# Patient Record
Sex: Male | Born: 1950 | Race: White | Hispanic: No | Marital: Married | Smoking: Former smoker
Health system: Southern US, Community
[De-identification: ages and names within clinical notes are randomized; demographics above are authoritative.]

## PROBLEM LIST (undated history)

## (undated) DIAGNOSIS — Z8709 Personal history of other diseases of the respiratory system: Secondary | ICD-10-CM

## (undated) DIAGNOSIS — M1990 Unspecified osteoarthritis, unspecified site: Secondary | ICD-10-CM

## (undated) HISTORY — PX: OTHER SURGICAL HISTORY: SHX169

## (undated) HISTORY — PX: HERNIA REPAIR: SHX51

---

## 2000-11-05 ENCOUNTER — Ambulatory Visit (HOSPITAL_COMMUNITY): Admission: RE | Admit: 2000-11-05 | Discharge: 2000-11-05 | Payer: Self-pay | Admitting: Gastroenterology

## 2003-12-26 ENCOUNTER — Ambulatory Visit: Payer: Self-pay | Admitting: Family Medicine

## 2005-10-07 ENCOUNTER — Ambulatory Visit: Payer: Self-pay | Admitting: Family Medicine

## 2006-02-12 ENCOUNTER — Ambulatory Visit: Payer: Self-pay | Admitting: Family Medicine

## 2006-08-13 ENCOUNTER — Ambulatory Visit: Payer: Self-pay | Admitting: Family Medicine

## 2010-07-05 NOTE — Procedures (Signed)
[place]  Patient:    Wesley Colon, WEEKLY Visit Number: [id number] MRN: [id number]          Service Type: END Location: ENDO Attending Physician:  Orland Mustard Dictated by:   Llana Aliment. Randa Evens, M.D. Proc. Date: 11/05/00 Admit Date:  11/05/2000   CC:         Delaney Meigs, M.D.   Procedure Report  PROCEDURE PERFORMED:  Colonoscopy.  ENDOSCOPIST:  Llana Aliment. Randa Evens, M.D.  MEDICATIONS USED:  Fentanyl 50 mcg, Versed 7 mg IV.  INSTRUMENT:  Adult Olympus video colonoscope.  INDICATIONS:  Family history of colon cancer.  DESCRIPTION OF PROCEDURE:  The procedure had been explained to the patient and consent obtained.  With the patient in the left lateral decubitus position, the Olympus video colonoscope was inserted.  The adult scope was used.  Using abdominal pressure and position changes, we were able to reach the cecum The ileocecal valve and appendiceal orifice were identified.  The patient had an excellent prep.  The scope was withdrawn.  The cecum, ascending colon, hepatic flexure, transverse colon, splenic flexure, descending and sigmoid colon were seen well upon withdrawal.  No polyps or other lesions were seen.  Scope withdrawn, patient tolerated the procedure well.  Maintained on low flow oxygen and pulse oximeter throughout the procedure.  ASSESSMENT:  No evidence of colon polyps in a gentleman who is at high risk for colon cancer.  PLAN:  Will recommend repeating the procedure in five years. Dictated by:   Llana Aliment. Randa Evens, M.D. Attending Physician:  Orland Mustard DD:  11/05/00 TD:  11/05/00 Job: [id number]

## 2015-03-01 ENCOUNTER — Ambulatory Visit (HOSPITAL_COMMUNITY)
Admission: RE | Admit: 2015-03-01 | Discharge: 2015-03-01 | Disposition: A | Discharge status: Home / Self Care | Payer: Medicare Other | Source: Ambulatory Visit | Attending: Family Medicine | Admitting: Family Medicine

## 2015-03-01 ENCOUNTER — Other Ambulatory Visit (HOSPITAL_COMMUNITY): Payer: Self-pay | Admitting: Family Medicine

## 2015-03-01 DIAGNOSIS — M5136 Other intervertebral disc degeneration, lumbar region: Secondary | ICD-10-CM | POA: Diagnosis not present

## 2015-03-01 DIAGNOSIS — M5441 Lumbago with sciatica, right side: Secondary | ICD-10-CM

## 2015-03-01 DIAGNOSIS — M5440 Lumbago with sciatica, unspecified side: Secondary | ICD-10-CM | POA: Insufficient documentation

## 2015-03-06 ENCOUNTER — Other Ambulatory Visit (HOSPITAL_COMMUNITY): Payer: Self-pay | Admitting: Family Medicine

## 2015-03-06 DIAGNOSIS — M545 Low back pain: Secondary | ICD-10-CM

## 2015-03-19 ENCOUNTER — Ambulatory Visit (HOSPITAL_COMMUNITY)
Admission: RE | Admit: 2015-03-19 | Discharge: 2015-03-19 | Disposition: A | Discharge status: Home / Self Care | Payer: Medicare Other | Source: Ambulatory Visit | Attending: Family Medicine | Admitting: Family Medicine

## 2015-03-19 DIAGNOSIS — M4806 Spinal stenosis, lumbar region: Secondary | ICD-10-CM | POA: Diagnosis not present

## 2015-03-19 DIAGNOSIS — M545 Low back pain: Secondary | ICD-10-CM | POA: Diagnosis present

## 2016-03-06 DIAGNOSIS — M25551 Pain in right hip: Secondary | ICD-10-CM | POA: Diagnosis not present

## 2016-03-06 DIAGNOSIS — M1611 Unilateral primary osteoarthritis, right hip: Secondary | ICD-10-CM | POA: Diagnosis not present

## 2016-03-10 NOTE — Progress Notes (Signed)
Please place orders in EPIC as patient is being scheduled for a pre-op appointment! Thank you! 

## 2016-03-12 DIAGNOSIS — M25551 Pain in right hip: Secondary | ICD-10-CM | POA: Diagnosis not present

## 2016-03-12 DIAGNOSIS — M1611 Unilateral primary osteoarthritis, right hip: Secondary | ICD-10-CM | POA: Diagnosis not present

## 2016-03-17 NOTE — Patient Instructions (Signed)
Wesley Colon  03/17/2016   Your procedure is scheduled on: 03/25/2016  Report to [facility] Main  Entrance take [place]  elevators to 3rd floor to  [place] at    [place] AM.  Call this number if you have problems the morning of surgery [phone]   Remember: ONLY 1 PERSON MAY GO WITH YOU TO SHORT STAY TO GET  READY MORNING OF [place].  Do not eat food or drink liquids :After Midnight.     Take these medicines the morning of surgery with A SIP OF WATER:                                 You may not have any metal on your body including hair pins and              piercings  Do not wear jewelry, make-up, lotions, powders or perfumes, deodorant                          Men may shave face and neck.   Do not bring valuables to the hospital. [place].  Contacts, dentures or bridgework may not be worn into surgery.  Leave suitcase in the car. After surgery it may be brought to your room.                  Please read over the following fact sheets you were given: _____________________________________________________________________             [facility] - Preparing for Surgery Before surgery, you can play an important role.  Because skin is not sterile, your skin needs to be as free of germs as possible.  You can reduce the number of germs on your skin by washing with CHG (chlorahexidine gluconate) soap before surgery.  CHG is an antiseptic cleaner which kills germs and bonds with the skin to continue killing germs even after washing. Please DO NOT use if you have an allergy to CHG or antibacterial soaps.  If your skin becomes reddened/irritated stop using the CHG and inform your nurse when you arrive at Short Stay. Do not shave (including legs and underarms) for at least 48 hours prior to the first CHG shower.  You may shave your face/neck. Please follow these instructions carefully:  1.  Shower with  CHG Soap the night before surgery and the  morning of Surgery.  2.  If you choose to wash your hair, wash your hair first as usual with your  normal  shampoo.  3.  After you shampoo, rinse your hair and body thoroughly to remove the  shampoo.                           4.  Use CHG as you would any other liquid soap.  You can apply chg directly  to the skin and wash                       Gently with a scrungie or clean washcloth.  5.  Apply the CHG Soap to your body ONLY FROM THE NECK DOWN.   Do not  use on face/ open                           Wound or open sores. Avoid contact with eyes, ears mouth and genitals (private parts).                       Wash face,  Genitals (private parts) with your normal soap.             6.  Wash thoroughly, paying special attention to the area where your surgery  will be performed.  7.  Thoroughly rinse your body with warm water from the neck down.  8.  DO NOT shower/wash with your normal soap after using and rinsing off  the CHG Soap.                9.  Pat yourself dry with a clean towel.            10.  Wear clean pajamas.            11.  Place clean sheets on your bed the night of your first shower and do not  sleep with pets. Day of Surgery : Do not apply any lotions/deodorants the morning of surgery.  Please wear clean clothes to the hospital/surgery center.  FAILURE TO FOLLOW THESE INSTRUCTIONS MAY RESULT IN THE CANCELLATION OF YOUR SURGERY PATIENT SIGNATURE_________________________________  NURSE SIGNATURE__________________________________  ________________________________________________________________________  WHAT IS A BLOOD TRANSFUSION? Blood Transfusion Information  A transfusion is the replacement of blood or some of its parts. Blood is made up of multiple cells which provide different functions.  Red blood cells carry oxygen and are used for blood loss replacement.  White blood cells fight against infection.  Platelets control  bleeding.  Plasma helps clot blood.  Other blood products are available for specialized needs, such as hemophilia or other clotting disorders. BEFORE THE TRANSFUSION  Who gives blood for transfusions?   Healthy volunteers who are fully evaluated to make sure their blood is safe. This is blood bank blood. Transfusion therapy is the safest it has ever been in the practice of medicine. Before blood is taken from a donor, a complete history is taken to make sure that person has no history of diseases nor engages in risky social behavior (examples are intravenous drug use or sexual activity with multiple partners). The donor's travel history is screened to minimize risk of transmitting infections, such as malaria. The donated blood is tested for signs of infectious diseases, such as HIV and hepatitis. The blood is then tested to be sure it is compatible with you in order to minimize the chance of a transfusion reaction. If you or a relative donates blood, this is often done in anticipation of surgery and is not appropriate for emergency situations. It takes many days to process the donated blood. RISKS AND COMPLICATIONS Although transfusion therapy is very safe and saves many lives, the main dangers of transfusion include:   Getting an infectious disease.  Developing a transfusion reaction. This is an allergic reaction to something in the blood you were given. Every precaution is taken to prevent this. The decision to have a blood transfusion has been considered carefully by your caregiver before blood is given. Blood is not given unless the benefits outweigh the risks. AFTER THE TRANSFUSION  Right after receiving a blood transfusion, you will usually feel much better and more energetic. This is especially true if  your red blood cells have gotten low (anemic). The transfusion raises the level of the red blood cells which carry oxygen, and this usually causes an energy increase.  The nurse  administering the transfusion will monitor you carefully for complications. HOME CARE INSTRUCTIONS  No special instructions are needed after a transfusion. You may find your energy is better. Speak with your caregiver about any limitations on activity for underlying diseases you may have. SEEK MEDICAL CARE IF:   Your condition is not improving after your transfusion.  You develop redness or irritation at the intravenous (IV) site. SEEK IMMEDIATE MEDICAL CARE IF:  Any of the following symptoms occur over the next 12 hours:  Shaking chills.  You have a temperature by mouth above 102 F (38.9 C), not controlled by medicine.  Chest, back, or muscle pain.  People around you feel you are not acting correctly or are confused.  Shortness of breath or difficulty breathing.  Dizziness and fainting.  You get a rash or develop hives.  You have a decrease in urine output.  Your urine turns a dark color or changes to pink, red, or brown. Any of the following symptoms occur over the next 10 days:  You have a temperature by mouth above 102 F (38.9 C), not controlled by medicine.  Shortness of breath.  Weakness after normal activity.  The white part of the eye turns yellow (jaundice).  You have a decrease in the amount of urine or are urinating less often.  Your urine turns a dark color or changes to pink, red, or brown. Document Released: 02/01/2000 Document Revised: 04/28/2011 Document Reviewed: 09/20/2007 ExitCare Patient Information 2014 [place].  _______________________________________________________________________  Incentive Spirometer  An incentive spirometer is a tool that can help keep your lungs clear and active. This tool measures how well you are filling your lungs with each breath. Taking long deep breaths may help reverse or decrease the chance of developing breathing (pulmonary) problems (especially infection) following:  A long period of time when you are  unable to move or be active. BEFORE THE PROCEDURE   If the spirometer includes an indicator to show your best effort, your nurse or respiratory therapist will set it to a desired goal.  If possible, sit up straight or lean slightly forward. Try not to slouch.  Hold the incentive spirometer in an upright position. INSTRUCTIONS FOR USE  1. Sit on the edge of your bed if possible, or sit up as far as you can in bed or on a chair. 2. Hold the incentive spirometer in an upright position. 3. Breathe out normally. 4. Place the mouthpiece in your mouth and seal your lips tightly around it. 5. Breathe in slowly and as deeply as possible, raising the piston or the ball toward the top of the column. 6. Hold your breath for 3-5 seconds or for as long as possible. Allow the piston or ball to fall to the bottom of the column. 7. Remove the mouthpiece from your mouth and breathe out normally. 8. Rest for a few seconds and repeat Steps 1 through 7 at least 10 times every 1-2 hours when you are awake. Take your time and take a few normal breaths between deep breaths. 9. The spirometer may include an indicator to show your best effort. Use the indicator as a goal to work toward during each repetition. 10. After each set of 10 deep breaths, practice coughing to be sure your lungs are clear. If you have an incision (  the cut made at the time of surgery), support your incision when coughing by placing a pillow or rolled up towels firmly against it. Once you are able to get out of bed, walk around indoors and cough well. You may stop using the incentive spirometer when instructed by your caregiver.  RISKS AND COMPLICATIONS  Take your time so you do not get dizzy or light-headed.  If you are in pain, you may need to take or ask for pain medication before doing incentive spirometry. It is harder to take a deep breath if you are having pain. AFTER USE  Rest and breathe slowly and easily.  It can be helpful to  keep track of a log of your progress. Your caregiver can provide you with a simple table to help with this. If you are using the spirometer at home, follow these instructions: [place] IF:   You are having difficultly using the spirometer.  You have trouble using the spirometer as often as instructed.  Your pain medication is not giving enough relief while using the spirometer.  You develop fever of 100.5 F (38.1 C) or higher. SEEK IMMEDIATE MEDICAL CARE IF:   You cough up bloody sputum that had not been present before.  You develop fever of 102 F (38.9 C) or greater.  You develop worsening pain at or near the incision site. MAKE SURE YOU:   Understand these instructions.  Will watch your condition.  Will get help right away if you are not doing well or get worse. Document Released: 06/16/2006 Document Revised: 04/28/2011 Document Reviewed: 08/17/2006 [facility] Patient Information 2014 [place], [place].   ________________________________________________________________________

## 2016-03-19 ENCOUNTER — Encounter (HOSPITAL_COMMUNITY): Payer: Self-pay

## 2016-03-19 ENCOUNTER — Encounter (HOSPITAL_COMMUNITY)
Admission: RE | Admit: 2016-03-19 | Discharge: 2016-03-19 | Disposition: A | Discharge status: Home / Self Care | Payer: PPO | Source: Ambulatory Visit | Attending: Orthopedic Surgery | Admitting: Orthopedic Surgery

## 2016-03-19 DIAGNOSIS — M1611 Unilateral primary osteoarthritis, right hip: Secondary | ICD-10-CM | POA: Diagnosis not present

## 2016-03-19 DIAGNOSIS — Z01818 Encounter for other preprocedural examination: Secondary | ICD-10-CM | POA: Insufficient documentation

## 2016-03-19 HISTORY — DX: Unspecified osteoarthritis, unspecified site: M19.90

## 2016-03-19 LAB — CBC
HEMATOCRIT: 44.7 % (ref 39.0–52.0)
HEMOGLOBIN: 15 g/dL (ref 13.0–17.0)
MCH: 31.4 pg (ref 26.0–34.0)
MCHC: 33.6 g/dL (ref 30.0–36.0)
MCV: 93.7 fL (ref 78.0–100.0)
Platelets: 251 10*3/uL (ref 150–400)
RBC: 4.77 MIL/uL (ref 4.22–5.81)
RDW: 12.9 % (ref 11.5–15.5)
WBC: 6.3 10*3/uL (ref 4.0–10.5)

## 2016-03-19 LAB — SURGICAL PCR SCREEN
MRSA, PCR: NEGATIVE
STAPHYLOCOCCUS AUREUS: POSITIVE — AB

## 2016-03-19 LAB — ABO/RH: ABO/RH(D): O NEG

## 2016-03-19 NOTE — Progress Notes (Signed)
LOV 11/27/15 on chart Clearance 12/04/15 on chart

## 2016-03-23 NOTE — H&P (Signed)
TOTAL HIP ADMISSION H&P  Patient is admitted for right total hip arthroplasty, anterior approach.  Subjective:  Chief Complaint:   Right hip primary OA / pain  HPI: Darolyn Rua, 66 y.o. male, has a history of pain and functional disability in the right hip(s) due to arthritis and patient has failed non-surgical conservative treatments for greater than 12 weeks to include NSAID's and/or analgesics, corticosteriod injections and activity modification.  Onset of symptoms was gradual starting 1.5+ years ago with gradually worsening course since that time.The patient noted no past surgery on the right hip(s).  Patient currently rates pain in the right hip at 10 out of 10 with activity. Patient has night pain, worsening of pain with activity and weight bearing, trendelenberg gait, pain that interfers with activities of daily living and pain with passive range of motion. Patient has evidence of periarticular osteophytes and joint space narrowing by imaging studies. This condition presents safety issues increasing the risk of falls. There is no current active infection.   Risks, benefits and expectations were discussed with the patient.  Risks including but not limited to the risk of anesthesia, blood clots, nerve damage, blood vessel damage, failure of the prosthesis, infection and up to and including death.  Patient understand the risks, benefits and expectations and wishes to proceed with surgery.   PCP: Sherrie Mustache, MD  D/C Plans:       Home - No PT,  HEP  Post-op Meds:       No Rx given  Tranexamic Acid:      To be given - IV  Decadron:      Is to be given  FYI:     ASA  Norco     Past Medical History:  Diagnosis Date  . Arthritis     Past Surgical History:  Procedure Laterality Date  . HERNIA REPAIR     inguinal 66 years old  . undescended testical     at 66 yr old    No prescriptions prior to admission.   No Known Allergies   Social History  Substance Use Topics   . Smoking status: Former Research scientist (life sciences)  . Smokeless tobacco: Never Used     Comment: quit when 66 years old  . Alcohol use No       Review of Systems  Constitutional: Negative.   HENT: Negative.   Eyes: Negative.   Respiratory: Negative.   Cardiovascular: Negative.   Gastrointestinal: Negative.   Genitourinary: Negative.   Musculoskeletal: Positive for joint pain.  Skin: Negative.   Neurological: Negative.   Endo/Heme/Allergies: Negative.   Psychiatric/Behavioral: Negative.     Objective:  Physical Exam  Constitutional: He is oriented to person, place, and time. He appears well-developed.  HENT:  Head: Normocephalic.  Eyes: Pupils are equal, round, and reactive to light.  Neck: Neck supple. No JVD present. No tracheal deviation present. No thyromegaly present.  Cardiovascular: Normal rate, regular rhythm, normal heart sounds and intact distal pulses.   Respiratory: Effort normal and breath sounds normal. No respiratory distress. He has no wheezes.  GI: Soft. There is no tenderness. There is no guarding.  Musculoskeletal:       Right hip: He exhibits decreased range of motion, decreased strength, tenderness and bony tenderness. He exhibits no swelling, no deformity and no laceration.  Lymphadenopathy:    He has no cervical adenopathy.  Neurological: He is alert and oriented to person, place, and time.  Skin: Skin is warm and dry.  Psychiatric:  He has a normal mood and affect.       Labs:  Estimated body mass index is 30.13 kg/m as calculated from the following:   Height as of 03/19/16: 5\' 10"  (1.778 m).   Weight as of 03/19/16: 95.3 kg (210 lb).   Imaging Review Plain radiographs demonstrate severe degenerative joint disease of the right hip(s). The bone quality appears to be good for age and reported activity level.  Assessment/Plan:  End stage arthritis, right hip(s)  The patient history, physical examination, clinical judgement of the provider and imaging  studies are consistent with end stage degenerative joint disease of the right hip(s) and total hip arthroplasty is deemed medically necessary. The treatment options including medical management, injection therapy, arthroscopy and arthroplasty were discussed at length. The risks and benefits of total hip arthroplasty were presented and reviewed. The risks due to aseptic loosening, infection, stiffness, dislocation/subluxation,  thromboembolic complications and other imponderables were discussed.  The patient acknowledged the explanation, agreed to proceed with the plan and consent was signed. Patient is being admitted for inpatient treatment for surgery, pain control, PT, OT, prophylactic antibiotics, VTE prophylaxis, progressive ambulation and ADL's and discharge planning.The patient is planning to be discharged home.      [place] Pugh Iretha Kirley   PA-C  03/23/2016, 2:32 PM

## 2016-03-24 NOTE — Anesthesia Preprocedure Evaluation (Addendum)
Anesthesia Evaluation  Patient identified by MRN, date of birth, ID band Patient awake    Reviewed: Allergy & Precautions, H&P , NPO status , Patient's Chart, lab work & pertinent test results  Airway Mallampati: II  TM Distance: >3 FB Neck ROM: Full    Dental no notable dental hx. (+) Teeth Intact, Dental Advisory Given   Pulmonary neg pulmonary ROS, former smoker,    Pulmonary exam normal breath sounds clear to auscultation       Cardiovascular Exercise Tolerance: Good negative cardio ROS   Rhythm:Regular Rate:Normal     Neuro/Psych negative neurological ROS  negative psych ROS   GI/Hepatic negative GI ROS, Neg liver ROS,   Endo/Other  negative endocrine ROS  Renal/GU negative Renal ROS  negative genitourinary   Musculoskeletal  (+) Arthritis , Osteoarthritis,    Abdominal   Peds  Hematology negative hematology ROS (+)   Anesthesia Other Findings   Reproductive/Obstetrics negative OB ROS                            Anesthesia Physical Anesthesia Plan  ASA: II  Anesthesia Plan: Spinal   Post-op Pain Management:    Induction: Intravenous  Airway Management Planned: Simple Face Mask  Additional Equipment:   Intra-op Plan:   Post-operative Plan:   Informed Consent: I have reviewed the patients History and Physical, chart, labs and discussed the procedure including the risks, benefits and alternatives for the proposed anesthesia with the patient or authorized representative who has indicated his/her understanding and acceptance.   Dental advisory given  Plan Discussed with: CRNA  Anesthesia Plan Comments:         Anesthesia Quick Evaluation

## 2016-03-25 ENCOUNTER — Inpatient Hospital Stay (HOSPITAL_COMMUNITY): Payer: PPO

## 2016-03-25 ENCOUNTER — Inpatient Hospital Stay (HOSPITAL_COMMUNITY): Payer: PPO | Admitting: Anesthesiology

## 2016-03-25 ENCOUNTER — Encounter (HOSPITAL_COMMUNITY)
Admission: RE | Disposition: A | Discharge status: Home / Self Care | Payer: Self-pay | Source: Ambulatory Visit | Attending: Orthopedic Surgery

## 2016-03-25 ENCOUNTER — Encounter (HOSPITAL_COMMUNITY): Payer: Self-pay | Admitting: *Deleted

## 2016-03-25 ENCOUNTER — Inpatient Hospital Stay (HOSPITAL_COMMUNITY)
Admission: RE | Admit: 2016-03-25 | Discharge: 2016-03-26 | DRG: 470 | Disposition: A | Discharge status: Home / Self Care | Payer: PPO | Source: Ambulatory Visit | Attending: Orthopedic Surgery | Admitting: Orthopedic Surgery

## 2016-03-25 DIAGNOSIS — Z471 Aftercare following joint replacement surgery: Secondary | ICD-10-CM | POA: Diagnosis not present

## 2016-03-25 DIAGNOSIS — M1611 Unilateral primary osteoarthritis, right hip: Secondary | ICD-10-CM | POA: Diagnosis not present

## 2016-03-25 DIAGNOSIS — E669 Obesity, unspecified: Secondary | ICD-10-CM | POA: Diagnosis not present

## 2016-03-25 DIAGNOSIS — Z96641 Presence of right artificial hip joint: Secondary | ICD-10-CM

## 2016-03-25 DIAGNOSIS — M25551 Pain in right hip: Secondary | ICD-10-CM | POA: Diagnosis not present

## 2016-03-25 DIAGNOSIS — Z6830 Body mass index (BMI) 30.0-30.9, adult: Secondary | ICD-10-CM | POA: Diagnosis not present

## 2016-03-25 DIAGNOSIS — Z96649 Presence of unspecified artificial hip joint: Secondary | ICD-10-CM

## 2016-03-25 DIAGNOSIS — Z87891 Personal history of nicotine dependence: Secondary | ICD-10-CM

## 2016-03-25 HISTORY — PX: TOTAL HIP ARTHROPLASTY: SHX124

## 2016-03-25 HISTORY — DX: Personal history of other diseases of the respiratory system: Z87.09

## 2016-03-25 LAB — TYPE AND SCREEN
ABO/RH(D): O NEG
Antibody Screen: NEGATIVE

## 2016-03-25 SURGERY — ARTHROPLASTY, HIP, TOTAL, ANTERIOR APPROACH
Anesthesia: Spinal | Site: Hip | Laterality: Right

## 2016-03-25 MED ORDER — FENTANYL CITRATE (PF) 100 MCG/2ML IJ SOLN
INTRAMUSCULAR | Status: AC
  Filled 2016-03-25: qty 2

## 2016-03-25 MED ORDER — CEFAZOLIN SODIUM-DEXTROSE 2-4 GM/100ML-% IV SOLN
INTRAVENOUS | Status: AC
  Filled 2016-03-25: qty 100

## 2016-03-25 MED ORDER — BISACODYL 10 MG RE SUPP: 10.0000 mg | Freq: Every day | RECTAL | Status: DC | PRN

## 2016-03-25 MED ORDER — SODIUM CHLORIDE 0.9 % IV SOLN
100.0000 mL/h | INTRAVENOUS | Status: DC
  Administered 2016-03-25 (×2): 100 mL/h via INTRAVENOUS
  Filled 2016-03-25 (×4): qty 1000

## 2016-03-25 MED ORDER — STERILE WATER FOR IRRIGATION IR SOLN
Status: DC | PRN
  Administered 2016-03-25: 2000 mL

## 2016-03-25 MED ORDER — BUPIVACAINE HCL (PF) 0.5 % IJ SOLN
INTRAMUSCULAR | Status: AC
  Filled 2016-03-25: qty 30

## 2016-03-25 MED ORDER — METHOCARBAMOL 500 MG PO TABS
500.0000 mg | ORAL_TABLET | Freq: Four times a day (QID) | ORAL | Status: DC | PRN
  Administered 2016-03-25 – 2016-03-26 (×2): 500 mg via ORAL
  Filled 2016-03-25 (×2): qty 1

## 2016-03-25 MED ORDER — HYDROMORPHONE HCL 1 MG/ML IJ SOLN
0.5000 mg | INTRAMUSCULAR | Status: DC | PRN
  Administered 2016-03-25 (×2): 1 mg via INTRAVENOUS
  Filled 2016-03-25 (×2): qty 1

## 2016-03-25 MED ORDER — ONDANSETRON HCL 4 MG PO TABS: 4.0000 mg | ORAL_TABLET | Freq: Four times a day (QID) | ORAL | Status: DC | PRN

## 2016-03-25 MED ORDER — CEFAZOLIN SODIUM-DEXTROSE 2-4 GM/100ML-% IV SOLN
2.0000 g | INTRAVENOUS | Status: AC
  Administered 2016-03-25: 2 g via INTRAVENOUS

## 2016-03-25 MED ORDER — CEFAZOLIN SODIUM-DEXTROSE 2-4 GM/100ML-% IV SOLN
2.0000 g | Freq: Four times a day (QID) | INTRAVENOUS | Status: AC
  Administered 2016-03-25 (×2): 2 g via INTRAVENOUS
  Filled 2016-03-25 (×2): qty 100

## 2016-03-25 MED ORDER — ASPIRIN 81 MG PO CHEW: 81.0000 mg | CHEWABLE_TABLET | Freq: Two times a day (BID) | ORAL | 0 refills | Status: AC

## 2016-03-25 MED ORDER — FENTANYL CITRATE (PF) 100 MCG/2ML IJ SOLN
INTRAMUSCULAR | Status: DC | PRN
  Administered 2016-03-25: 100 ug via INTRAVENOUS

## 2016-03-25 MED ORDER — MENTHOL 3 MG MT LOZG
1.0000 | LOZENGE | OROMUCOSAL | Status: DC | PRN
  Filled 2016-03-25: qty 9

## 2016-03-25 MED ORDER — PHENOL 1.4 % MT LIQD
1.0000 | OROMUCOSAL | Status: DC | PRN
  Filled 2016-03-25: qty 177

## 2016-03-25 MED ORDER — POLYETHYLENE GLYCOL 3350 17 G PO PACK
17.0000 g | PACK | Freq: Two times a day (BID) | ORAL | Status: DC
  Administered 2016-03-25 – 2016-03-26 (×2): 17 g via ORAL
  Filled 2016-03-25 (×2): qty 1

## 2016-03-25 MED ORDER — FERROUS SULFATE 325 (65 FE) MG PO TABS: 325.0000 mg | ORAL_TABLET | Freq: Three times a day (TID) | ORAL | Status: DC

## 2016-03-25 MED ORDER — DEXAMETHASONE SODIUM PHOSPHATE 10 MG/ML IJ SOLN
10.0000 mg | Freq: Once | INTRAMUSCULAR | Status: AC
  Administered 2016-03-25: 10 mg via INTRAVENOUS

## 2016-03-25 MED ORDER — MIDAZOLAM HCL 5 MG/5ML IJ SOLN
INTRAMUSCULAR | Status: DC | PRN
  Administered 2016-03-25: 2 mg via INTRAVENOUS

## 2016-03-25 MED ORDER — PHENYLEPHRINE 40 MCG/ML (10ML) SYRINGE FOR IV PUSH (FOR BLOOD PRESSURE SUPPORT)
PREFILLED_SYRINGE | INTRAVENOUS | Status: AC
  Filled 2016-03-25: qty 10

## 2016-03-25 MED ORDER — HYDROMORPHONE HCL 1 MG/ML IJ SOLN: 0.2500 mg | INTRAMUSCULAR | Status: DC | PRN

## 2016-03-25 MED ORDER — METOCLOPRAMIDE HCL 5 MG PO TABS
5.0000 mg | ORAL_TABLET | Freq: Three times a day (TID) | ORAL | Status: DC | PRN
Start: 2016-03-25 — End: 2016-03-26

## 2016-03-25 MED ORDER — DOCUSATE SODIUM 100 MG PO CAPS: 100.0000 mg | ORAL_CAPSULE | Freq: Two times a day (BID) | ORAL | 0 refills | Status: DC

## 2016-03-25 MED ORDER — DEXAMETHASONE SODIUM PHOSPHATE 10 MG/ML IJ SOLN
INTRAMUSCULAR | Status: AC
  Filled 2016-03-25: qty 1

## 2016-03-25 MED ORDER — METHOCARBAMOL 500 MG PO TABS: 500.0000 mg | ORAL_TABLET | Freq: Four times a day (QID) | ORAL | 0 refills | Status: DC | PRN

## 2016-03-25 MED ORDER — PROPOFOL 10 MG/ML IV BOLUS
INTRAVENOUS | Status: AC
  Filled 2016-03-25: qty 40

## 2016-03-25 MED ORDER — ONDANSETRON HCL 4 MG/2ML IJ SOLN
INTRAMUSCULAR | Status: AC
  Filled 2016-03-25: qty 2

## 2016-03-25 MED ORDER — SODIUM CHLORIDE 0.9 % IR SOLN
Status: DC | PRN
  Administered 2016-03-25: 1000 mL

## 2016-03-25 MED ORDER — MAGNESIUM CITRATE PO SOLN: 1.0000 | Freq: Once | ORAL | Status: DC | PRN

## 2016-03-25 MED ORDER — ALUM & MAG HYDROXIDE-SIMETH 200-200-20 MG/5ML PO SUSP: 30.0000 mL | ORAL | Status: DC | PRN

## 2016-03-25 MED ORDER — HYDROCODONE-ACETAMINOPHEN 7.5-325 MG PO TABS
1.0000 | ORAL_TABLET | ORAL | Status: DC
  Administered 2016-03-25 – 2016-03-26 (×6): 2 via ORAL
  Filled 2016-03-25 (×6): qty 2

## 2016-03-25 MED ORDER — FERROUS SULFATE 325 (65 FE) MG PO TABS
325.0000 mg | ORAL_TABLET | Freq: Three times a day (TID) | ORAL | Status: DC
  Administered 2016-03-25 – 2016-03-26 (×2): 325 mg via ORAL
  Filled 2016-03-25 (×2): qty 1

## 2016-03-25 MED ORDER — ONDANSETRON HCL 4 MG/2ML IJ SOLN: 4.0000 mg | Freq: Four times a day (QID) | INTRAMUSCULAR | Status: DC | PRN

## 2016-03-25 MED ORDER — METHOCARBAMOL 1000 MG/10ML IJ SOLN
500.0000 mg | Freq: Four times a day (QID) | INTRAVENOUS | Status: DC | PRN
  Filled 2016-03-25: qty 5

## 2016-03-25 MED ORDER — ONDANSETRON HCL 4 MG/2ML IJ SOLN
INTRAMUSCULAR | Status: DC | PRN
  Administered 2016-03-25: 4 mg via INTRAVENOUS

## 2016-03-25 MED ORDER — DIPHENHYDRAMINE HCL 25 MG PO CAPS: 25.0000 mg | ORAL_CAPSULE | Freq: Four times a day (QID) | ORAL | Status: DC | PRN

## 2016-03-25 MED ORDER — MIDAZOLAM HCL 2 MG/2ML IJ SOLN
INTRAMUSCULAR | Status: AC
  Filled 2016-03-25: qty 2

## 2016-03-25 MED ORDER — ASPIRIN 81 MG PO CHEW
81.0000 mg | CHEWABLE_TABLET | Freq: Two times a day (BID) | ORAL | Status: DC
  Administered 2016-03-25 – 2016-03-26 (×2): 81 mg via ORAL
  Filled 2016-03-25 (×2): qty 1

## 2016-03-25 MED ORDER — PHENYLEPHRINE 40 MCG/ML (10ML) SYRINGE FOR IV PUSH (FOR BLOOD PRESSURE SUPPORT)
PREFILLED_SYRINGE | INTRAVENOUS | Status: DC | PRN
  Administered 2016-03-25 (×6): 80 ug via INTRAVENOUS

## 2016-03-25 MED ORDER — TRANEXAMIC ACID 1000 MG/10ML IV SOLN
1000.0000 mg | INTRAVENOUS | Status: AC
  Administered 2016-03-25: 1000 mg via INTRAVENOUS
  Filled 2016-03-25: qty 1100

## 2016-03-25 MED ORDER — LACTATED RINGERS IV SOLN
INTRAVENOUS | Status: DC | PRN
  Administered 2016-03-25 (×3): via INTRAVENOUS

## 2016-03-25 MED ORDER — CHLORHEXIDINE GLUCONATE 4 % EX LIQD: 60.0000 mL | Freq: Once | CUTANEOUS | Status: DC

## 2016-03-25 MED ORDER — PROPOFOL 500 MG/50ML IV EMUL
INTRAVENOUS | Status: DC | PRN
  Administered 2016-03-25: 100 ug/kg/min via INTRAVENOUS

## 2016-03-25 MED ORDER — POLYETHYLENE GLYCOL 3350 17 G PO PACK: 17.0000 g | PACK | Freq: Two times a day (BID) | ORAL | 0 refills | Status: DC

## 2016-03-25 MED ORDER — DOCUSATE SODIUM 100 MG PO CAPS
100.0000 mg | ORAL_CAPSULE | Freq: Two times a day (BID) | ORAL | Status: DC
  Administered 2016-03-25 – 2016-03-26 (×2): 100 mg via ORAL
  Filled 2016-03-25 (×2): qty 1

## 2016-03-25 MED ORDER — DEXAMETHASONE SODIUM PHOSPHATE 10 MG/ML IJ SOLN
10.0000 mg | Freq: Once | INTRAMUSCULAR | Status: AC
  Administered 2016-03-26: 10 mg via INTRAVENOUS
  Filled 2016-03-25: qty 1

## 2016-03-25 MED ORDER — HYDROCODONE-ACETAMINOPHEN 7.5-325 MG PO TABS: 1.0000 | ORAL_TABLET | ORAL | 0 refills | Status: DC | PRN

## 2016-03-25 MED ORDER — METOCLOPRAMIDE HCL 5 MG/ML IJ SOLN
5.0000 mg | Freq: Three times a day (TID) | INTRAMUSCULAR | Status: DC | PRN
Start: 2016-03-25 — End: 2016-03-26

## 2016-03-25 MED ORDER — BUPIVACAINE HCL (PF) 0.5 % IJ SOLN
INTRAMUSCULAR | Status: DC | PRN
  Administered 2016-03-25: 15 mg via INTRATHECAL

## 2016-03-25 MED ORDER — CELECOXIB 200 MG PO CAPS
200.0000 mg | ORAL_CAPSULE | Freq: Two times a day (BID) | ORAL | Status: DC
  Administered 2016-03-25 – 2016-03-26 (×2): 200 mg via ORAL
  Filled 2016-03-25 (×2): qty 1

## 2016-03-25 SURGICAL SUPPLY — 35 items
BAG ZIPLOCK 12X15 (MISCELLANEOUS) IMPLANT
BLADE SAG 18X100X1.27 (BLADE) ×3 IMPLANT
CAPT HIP TOTAL 2 ×3 IMPLANT
CLOTH BEACON ORANGE TIMEOUT ST (SAFETY) ×3 IMPLANT
COVER PERINEAL POST (MISCELLANEOUS) ×3 IMPLANT
DERMABOND ADVANCED (GAUZE/BANDAGES/DRESSINGS) ×2
DERMABOND ADVANCED .7 DNX12 (GAUZE/BANDAGES/DRESSINGS) ×1 IMPLANT
DRAPE STERI IOBAN 125X83 (DRAPES) ×3 IMPLANT
DRAPE U-SHAPE 47X51 STRL (DRAPES) ×6 IMPLANT
DRESSING AQUACEL AG SP 3.5X10 (GAUZE/BANDAGES/DRESSINGS) ×1 IMPLANT
DRSG AQUACEL AG ADV 3.5X10 (GAUZE/BANDAGES/DRESSINGS) ×3 IMPLANT
DRSG AQUACEL AG SP 3.5X10 (GAUZE/BANDAGES/DRESSINGS) ×3
DURAPREP 26ML APPLICATOR (WOUND CARE) ×3 IMPLANT
ELECT REM PT RETURN 9FT ADLT (ELECTROSURGICAL) ×3
ELECTRODE REM PT RTRN 9FT ADLT (ELECTROSURGICAL) ×1 IMPLANT
GLOVE BIOGEL PI IND STRL 7.5 (GLOVE) ×5 IMPLANT
GLOVE BIOGEL PI IND STRL 8.5 (GLOVE) ×1 IMPLANT
GLOVE BIOGEL PI INDICATOR 7.5 (GLOVE) ×10
GLOVE BIOGEL PI INDICATOR 8.5 (GLOVE) ×2
GLOVE ECLIPSE 8.0 STRL XLNG CF (GLOVE) ×6 IMPLANT
GLOVE ORTHO TXT STRL SZ7.5 (GLOVE) ×3 IMPLANT
GLOVE SURG SS PI 7.0 STRL IVOR (GLOVE) ×3 IMPLANT
GLOVE SURG SS PI 7.5 STRL IVOR (GLOVE) ×3 IMPLANT
GOWN STRL REUS W/ TWL XL LVL3 (GOWN DISPOSABLE) ×1 IMPLANT
GOWN STRL REUS W/TWL LRG LVL3 (GOWN DISPOSABLE) ×3 IMPLANT
GOWN STRL REUS W/TWL XL LVL3 (GOWN DISPOSABLE) ×8 IMPLANT
HOLDER FOLEY CATH W/STRAP (MISCELLANEOUS) ×3 IMPLANT
PACK ANTERIOR HIP CUSTOM (KITS) ×3 IMPLANT
SUT MNCRL AB 4-0 PS2 18 (SUTURE) ×3 IMPLANT
SUT VIC AB 1 CT1 36 (SUTURE) ×9 IMPLANT
SUT VIC AB 2-0 CT1 27 (SUTURE) ×4
SUT VIC AB 2-0 CT1 TAPERPNT 27 (SUTURE) ×2 IMPLANT
SUT VLOC 180 0 24IN GS25 (SUTURE) ×3 IMPLANT
TRAY FOLEY W/METER SILVER 16FR (SET/KITS/TRAYS/PACK) ×3 IMPLANT
YANKAUER SUCT BULB TIP 10FT TU (MISCELLANEOUS) ×3 IMPLANT

## 2016-03-25 NOTE — Discharge Instructions (Signed)

## 2016-03-25 NOTE — Interval H&P Note (Signed)
History and Physical Interval Note:  03/25/2016 7:04 AM  Wesley Colon  has presented today for surgery, with the diagnosis of Right hip osteoarthritis  The various methods of treatment have been discussed with the patient and family. After consideration of risks, benefits and other options for treatment, the patient has consented to  Procedure(s) with comments: RIGHT TOTAL HIP ARTHROPLASTY ANTERIOR APPROACH (Right) - Requests 70 mins as a surgical intervention .  The patient's history has been reviewed, patient examined, no change in status, stable for surgery.  I have reviewed the patient's chart and labs.  Questions were answered to the patient's satisfaction.     Mauri Pole

## 2016-03-25 NOTE — Op Note (Signed)
NAME:  Wesley Colon                ACCOUNT NO.: [id number]      MEDICAL RECORD NO.: [id number]      FACILITY:  [facility]      PHYSICIAN:  Paralee Cancel D  DATE OF BIRTH:  September 28, 1950     DATE OF PROCEDURE:  03/25/2016                                 OPERATIVE REPORT         PREOPERATIVE DIAGNOSIS: Right  hip osteoarthritis.      POSTOPERATIVE DIAGNOSIS:  Right hip osteoarthritis.      PROCEDURE:  Right total hip replacement through an anterior approach   utilizing DePuy THR system, component size 103mm pinnacle cup, a size 36+4 neutral   Altrex liner, a size 6 Hi Tri Lock stem with a 36+1.5 delta ceramic   ball.      SURGEON:  Pietro Cassis. Alvan Dame, M.D.      ASSISTANT:  Danae Orleans, PA-C     ANESTHESIA:  Spinal.      SPECIMENS:  None.      COMPLICATIONS:  None.      BLOOD LOSS:  400 cc     DRAINS:  None.      INDICATION OF THE PROCEDURE:  Wesley Colon is a 66 y.o. male who had   presented to office for evaluation of right hip pain.  Radiographs revealed   progressive degenerative changes with bone-on-bone   articulation to the  hip joint.  The patient had painful limited range of   motion significantly affecting their overall quality of life.  The patient was failing to    respond to conservative measures, and at this point was ready   to proceed with more definitive measures.  The patient has noted progressive   degenerative changes in his hip, progressive problems and dysfunction   with regarding the hip prior to surgery.  Consent was obtained for   benefit of pain relief.  Specific risk of infection, DVT, component   failure, dislocation, need for revision surgery, as well discussion of   the anterior versus posterior approach were reviewed.  Consent was   obtained for benefit of anterior pain relief through an anterior   approach.      PROCEDURE IN DETAIL:  The patient was brought to operative theater.   Once adequate anesthesia,  preoperative antibiotics, 2gm of Ancef, 1 gm of Tranexamic Acid, and 10 mg of Decadron administered.   The patient was positioned supine on the OSI Hanna table.  Once adequate   padding of boney process was carried out, we had predraped out the hip, and  used fluoroscopy to confirm orientation of the pelvis and position.      The right hip was then prepped and draped from proximal iliac crest to   mid thigh with shower curtain technique.      Time-out was performed identifying the patient, planned procedure, and   extremity.     An incision was then made 2 cm distal and lateral to the   anterior superior iliac spine extending over the orientation of the   tensor fascia lata muscle and sharp dissection was carried down to the   fascia of the muscle and protractor placed in the soft tissues.      The fascia was  then incised.  The muscle belly was identified and swept   laterally and retractor placed along the superior neck.  Following   cauterization of the circumflex vessels and removing some pericapsular   fat, a second cobra retractor was placed on the inferior neck.  A third   retractor was placed on the anterior acetabulum after elevating the   anterior rectus.  A L-capsulotomy was along the line of the   superior neck to the trochanteric fossa, then extended proximally and   distally.  Tag sutures were placed and the retractors were then placed   intracapsular.  We then identified the trochanteric fossa and   orientation of my neck cut, confirmed this radiographically   and then made a neck osteotomy with the femur on traction.  The femoral   head was removed without difficulty or complication.  Traction was let   off and retractors were placed posterior and anterior around the   acetabulum.      The labrum and foveal tissue were debrided.  I began reaming with a 37mm   reamer and reamed up to 51mm reamer with good bony bed preparation and a 71mm   cup was chosen.  The final 64mm  Pinnacle cup was then impacted under fluoroscopy  to confirm the depth of penetration and orientation with respect to   abduction.  A screw was placed followed by the hole eliminator.  The final   36+4 neutral Altrex liner was impacted with good visualized rim fit.  The cup was positioned anatomically within the acetabular portion of the pelvis.      At this point, the femur was rolled at 80 degrees.  Further capsule was   released off the inferior aspect of the femoral neck.  I then   released the superior capsule proximally.  The hook was placed laterally   along the femur and elevated manually and held in position with the bed   hook.  The leg was then extended and adducted with the leg rolled to 100   degrees of external rotation.  Once the proximal femur was fully   exposed, I used a box osteotome to set orientation.  I then began   broaching with the starting chili pepper broach and passed this by hand and then broached up to 6.  With the 6 broach in place I chose a high offset neck and did trial reductions.  The offset was appropriate, leg lengths   appeared to be equal best matched with the +1.5 head ball confirmed radiographically.   Given these findings, I went ahead and dislocated the hip, repositioned all   retractors and positioned the right hip in the extended and abducted position.  The final 6 Hi Tri Lock stem was   chosen and it was impacted down to the level of neck cut.  Based on this   and the trial reduction, a 36+1.5 delta ceramic ball was chosen and   impacted onto a clean and dry trunnion, and the hip was reduced.  The   hip had been irrigated throughout the case again at this point.  I did   reapproximate the superior capsular leaflet to the anterior leaflet   using #1 Vicryl.  The fascia of the   tensor fascia lata muscle was then reapproximated using #1 Vicryl and #0 Stratafix sutures.  The   remaining wound was closed with 2-0 Vicryl and running 4-0 Monocryl.   The  hip was cleaned, dried, and dressed  sterilely using Dermabond and   Aquacel dressing.  He was then brought   to recovery room in stable condition tolerating the procedure well.    Danae Orleans, PA-C was present for the entirety of the case involved from   preoperative positioning, perioperative retractor management, general   facilitation of the case, as well as primary wound closure as assistant.            Pietro Cassis Alvan Dame, M.D.        03/25/2016 8:44 AM

## 2016-03-25 NOTE — Anesthesia Postprocedure Evaluation (Signed)
Anesthesia Post Note  Patient: Wesley Colon  Procedure(s) Performed: Procedure(s) (LRB): RIGHT TOTAL HIP ARTHROPLASTY ANTERIOR APPROACH (Right)  Patient location during evaluation: PACU Anesthesia Type: Spinal Level of consciousness: awake and alert Pain management: pain level controlled Vital Signs Assessment: post-procedure vital signs reviewed and stable Respiratory status: spontaneous breathing and respiratory function stable Cardiovascular status: blood pressure returned to baseline and stable Postop Assessment: spinal receding Anesthetic complications: no       Last Vitals:  Vitals:   03/25/16 1100 03/25/16 1120  BP: 110/82   Pulse: 83   Resp: 12 15  Temp: 36.7 C     Last Pain:  Vitals:   03/25/16 1100  TempSrc:   PainSc: 0-No pain                 Brenda Cowher,W. EDMOND

## 2016-03-25 NOTE — Anesthesia Procedure Notes (Signed)
Spinal  Patient location during procedure: OR Start time: 03/25/2016 7:14 AM End time: 03/25/2016 7:16 AM Staffing Resident/CRNA: Harle Stanford R Performed: resident/CRNA  Preanesthetic Checklist Completed: patient identified, site marked, surgical consent, pre-op evaluation, timeout performed, IV checked, risks and benefits discussed and monitors and equipment checked Spinal Block Patient position: sitting Prep: Betadine Patient monitoring: heart rate, cardiac monitor, continuous pulse ox and blood pressure Approach: midline Location: L3-4 Needle Needle type: Pencan  Needle gauge: 24 G Needle length: 10 cm Needle insertion depth: 7 cm Assessment Sensory level: T6 Additional Notes Timeout performed. SAB kit date checked. SAB without difficulty

## 2016-03-25 NOTE — Evaluation (Signed)
Physical Therapy Evaluation Patient Details Name: Wesley Colon MRN: [id number] DOB: [birth year] Today's Date: 03/25/2016   History of Present Illness  R DATHA  Clinical Impression  The patient is progressing very well. Ambulated x 200'  Plans Dc  Home with family. Pt admitted with above diagnosis. Pt currently with functional limitations due to the deficits listed below (see PT Problem List) Pt will benefit from skilled PT to increase their independence and safety with mobility to allow discharge to the venue listed below.       Follow Up Recommendations No PT follow up    Equipment Recommendations  None recommended by PT    Recommendations for Other Services       Precautions / Restrictions Precautions Precautions: Fall      Mobility  Bed Mobility Overal bed mobility: Needs Assistance Bed Mobility: Supine to Sit;Sit to Supine     Supine to sit: Min assist Sit to supine: Min assist   General bed mobility comments: assist with rt leg  Transfers Overall transfer level: Needs assistance Equipment used: Rolling walker (2 wheeled) Transfers: Sit to/from Stand Sit to Stand: Min assist         General transfer comment: cues for sfeaty and hand and rt leg position  Ambulation/Gait Ambulation/Gait assistance: Min assist Ambulation Distance (Feet): 200 Feet Assistive device: Rolling walker (2 wheeled) Gait Pattern/deviations: Step-to pattern;Step-through pattern     General Gait Details: cues for sequence  Stairs            Wheelchair Mobility    Modified Rankin (Stroke Patients Only)       Balance                                             Pertinent Vitals/Pain Pain Assessment: 0-10 Pain Score: 4  Pain Location: r hip Pain Descriptors / Indicators: Aching Pain Intervention(s): Premedicated before session    Home Living Family/patient expects to be discharged to:: Private residence Living Arrangements: Spouse/significant  other Available Help at Discharge: Family Type of Home: House Home Access: Stairs to enter Entrance Stairs-Rails: Psychiatric nurse of Steps: 2 Home Layout: One level Home Equipment: Environmental consultant - 2 wheels;Bedside commode      Prior Function                 Hand Dominance        Extremity/Trunk Assessment   Upper Extremity Assessment Upper Extremity Assessment: Overall WFL for tasks assessed    Lower Extremity Assessment Lower Extremity Assessment: RLE deficits/detail RLE Deficits / Details: advances the leg        Communication      Cognition Arousal/Alertness: Awake/alert Behavior During Therapy: WFL for tasks assessed/performed Overall Cognitive Status: Within Functional Limits for tasks assessed                      General Comments      Exercises     Assessment/Plan    PT Assessment Patient needs continued PT services  PT Problem List Decreased strength;Decreased range of motion;Decreased activity tolerance;Decreased mobility;Decreased knowledge of precautions;Decreased safety awareness;Decreased knowledge of use of DME;Pain          PT Treatment Interventions DME instruction;Gait training;Stair training;Functional mobility training;Therapeutic activities;Therapeutic exercise;Patient/family education    PT Goals (Current goals can be found in the Care Plan section)  Acute Rehab PT Goals  Patient Stated Goal: to walk without a limp PT Goal Formulation: With patient Time For Goal Achievement: 03/27/16 Potential to Achieve Goals: Good    Frequency 7X/week   Barriers to discharge        Co-evaluation               End of Session   Activity Tolerance: Patient tolerated treatment well Patient left: in bed;with call bell/phone within reach;with bed alarm set;with family/visitor present Nurse Communication: Mobility status         Time: [id number] PT Time Calculation (min) (ACUTE ONLY): 17 min   Charges:   PT  Evaluation $PT Eval Low Complexity: 1 Procedure     PT G CodesClaretha Colon 03/25/2016, 4:32 PM

## 2016-03-25 NOTE — Transfer of Care (Signed)
Immediate Anesthesia Transfer of Care Note  Patient: Wesley Colon  Procedure(s) Performed: Procedure(s) with comments: RIGHT TOTAL HIP ARTHROPLASTY ANTERIOR APPROACH (Right) - Requests 70 mins  Patient Location: PACU  Anesthesia Type:Spinal  Level of Consciousness: sedated  Airway & Oxygen Therapy: Patient Spontanous Breathing and Patient connected to face mask oxygen  Post-op Assessment: Report given to RN and Post -op Vital signs reviewed and stable  Post vital signs: Reviewed and stable  Last Vitals:  Vitals:   03/25/16 0509  BP: (!) 158/91  Pulse: 100  Resp: 18  Temp: 36.8 C    Last Pain:  Vitals:   03/25/16 0527  TempSrc:   PainSc: 8       Patients Stated Pain Goal: 2 ([id number])  Complications: No apparent anesthesia complications

## 2016-03-26 DIAGNOSIS — E669 Obesity, unspecified: Secondary | ICD-10-CM | POA: Diagnosis present

## 2016-03-26 LAB — CBC
HEMATOCRIT: 36.2 % — AB (ref 39.0–52.0)
HEMOGLOBIN: 11.9 g/dL — AB (ref 13.0–17.0)
MCH: 30.4 pg (ref 26.0–34.0)
MCHC: 32.9 g/dL (ref 30.0–36.0)
MCV: 92.3 fL (ref 78.0–100.0)
Platelets: 229 10*3/uL (ref 150–400)
RBC: 3.92 MIL/uL — AB (ref 4.22–5.81)
RDW: 13.1 % (ref 11.5–15.5)
WBC: 10.9 10*3/uL — ABNORMAL HIGH (ref 4.0–10.5)

## 2016-03-26 LAB — BASIC METABOLIC PANEL
Anion gap: 7 (ref 5–15)
BUN: 11 mg/dL (ref 6–20)
CHLORIDE: 109 mmol/L (ref 101–111)
CO2: 23 mmol/L (ref 22–32)
Calcium: 8.1 mg/dL — ABNORMAL LOW (ref 8.9–10.3)
Creatinine, Ser: 0.91 mg/dL (ref 0.61–1.24)
GFR calc non Af Amer: >60 mL/min (ref >60)
Glucose, Bld: 127 mg/dL — ABNORMAL HIGH (ref 65–99)
POTASSIUM: 3.9 mmol/L (ref 3.5–5.1)
SODIUM: 139 mmol/L (ref 135–145)

## 2016-03-26 NOTE — Evaluation (Signed)
   Occupational Therapy Evaluation Patient Details Name: CLOUDE BITTEL MRN: [id number] DOB: [birth year] Today's Date: 04/16/16    History of Present Illness R DATHA   Clinical Impression   Pt is s/p THA- anterior resulting in the deficits listed below (see OT Problem List). Pt will benefit from skilled OT to increase their safety and independence with ADL and functional mobility for ADL to facilitate discharge to venue listed below.      Follow Up Recommendations  No OT follow up    Equipment Recommendations  None recommended by OT       Precautions / Restrictions Precautions Precautions: Fall Restrictions Weight Bearing Restrictions: No      Mobility Bed Mobility Overal bed mobility: Modified Independent                Transfers Overall transfer level: Modified independent      VC for safety and tecnique                   ADL Overall ADL's : Needs assistance/impaired          Pt overall S - min A with ADL activity . OT educated on ADL activity s/p THA- anterior.Wife will also A as needed.                                      Extremity/Trunk Assessment Upper Extremity Assessment Upper Extremity Assessment: Overall WFL for tasks assessed           Communication     Cognition Arousal/Alertness: Awake/alert Behavior During Therapy: WFL for tasks assessed/performed Overall Cognitive Status: Within Functional Limits for tasks assessed                                Home Living Family/patient expects to be discharged to:: Private residence Living Arrangements: Spouse/significant other Available Help at Discharge: Family Type of Home: House Home Access: Stairs to enter Technical brewer of Steps: 2 Entrance Stairs-Rails: Right;Left Home Layout: One level     Bathroom Shower/Tub: Tub/shower unit         Home Equipment: Environmental consultant - 2 wheels;Bedside commode                         OT  Goals(Current goals can be found in the care plan section) Acute Rehab OT Goals Patient Stated Goal: to walk without a limp  OT Frequency:                End of Session Equipment Utilized During Treatment: Rolling walker Nurse Communication: Mobility status  Activity Tolerance: Patient tolerated treatment well Patient left: in chair   Time: [id number] OT Time Calculation (min): 10 min Charges:  OT General Charges $OT Visit: 1 Procedure OT Evaluation $OT Eval Low Complexity: 1 Procedure G-Codes:    Payton Mccallum D April 16, 2016, 12:02 PM

## 2016-03-26 NOTE — Progress Notes (Signed)
     Subjective: 1 Day Post-Op Procedure(s) (LRB): RIGHT TOTAL HIP ARTHROPLASTY ANTERIOR APPROACH (Right)   Patient reports pain as mild, pain controlled.  No events throughout the night. Feels that he is progressing well.  Ready to be discharged home.   Objective:   VITALS:   Vitals:   03/26/16 0213 03/26/16 0638  BP: 112/69 121/70  Pulse: 78 90  Resp: 16 16  Temp: 98.5 F (36.9 C) 99.4 F (37.4 C)    Dorsiflexion/Plantar flexion intact Incision: dressing C/D/I No cellulitis present Compartment soft  LABS  Recent Labs  03/26/16 0451  HGB 11.9*  HCT 36.2*  WBC 10.9*  PLT 229     Recent Labs  03/26/16 0451  NA 139  K 3.9  BUN 11  CREATININE 0.91  GLUCOSE 127*     Assessment/Plan: 1 Day Post-Op Procedure(s) (LRB): RIGHT TOTAL HIP ARTHROPLASTY ANTERIOR APPROACH (Right) Foley cath d/c'ed Advance diet Up with therapy D/C IV fluids Discharge home Follow up in 2 weeks at [facility]. Follow up with OLIN,Merlyn Conley D in 2 weeks.  Contact information:  [facility] [address], [place] [id number]    Obese (BMI 30-39.9) Estimated body mass index is 30.42 kg/m as calculated from the following:   Height as of this encounter: 5\' 10"  (1.778 m).   Weight as of this encounter: 96.2 kg (212 lb). Patient also counseled that weight may inhibit the healing process Patient counseled that losing weight will help with future health issues        West Pugh. Domanique Huesman   PAC  03/26/2016, 8:24 AM

## 2016-03-26 NOTE — Care Management Note (Signed)
Case Management Note  Patient Details  Name: Wesley Colon MRN: [id number] Date of Birth: [birth year]  Subjective/Objective:                  RIGHT TOTAL HIP ARTHROPLASTY ANTERIOR APPROACH (Right)  Action/Plan: Discharge planning Expected Discharge Date:  03/26/16               Expected Discharge Plan:  Home/Self Care  In-House Referral:     Discharge planning Services  CM Consult  Post Acute Care Choice:  Durable Medical Equipment Choice offered to:  Patient  DME Arranged:  N/A DME Agency:  NA  HH Arranged:  NA HH Agency:  NA  Status of Service:  Completed, signed off  If discussed at Christmas of Stay Meetings, dates discussed:    Additional Comments: CM met with pt in room to discuss discharge needs. Pt confirms no HH services and states he has both a 3n1 and rolling walker at home. NO other CM needs were communicated. Dellie Catholic, RN 03/26/2016, 10:39 AM

## 2016-03-26 NOTE — Progress Notes (Signed)
Physical Therapy Treatment Patient Details Name: NIVAAN FUHR MRN: [id number] DOB: [birth year] Today's Date: 03/26/2016    History of Present Illness R DA THA    PT Comments    Pt ambulated in hallway and performed steps.  Pt tolerating mobility very well.  Also educated and performed LE exercises.  Pt provided with HEP handout.  Pt had no further questions and feels comfortable with d/c home today.   Follow Up Recommendations  No PT follow up     Equipment Recommendations  None recommended by PT    Recommendations for Other Services       Precautions / Restrictions Precautions Precautions: Fall Restrictions Weight Bearing Restrictions: No Other Position/Activity Restrictions: WBAT    Mobility  Bed Mobility Overal bed mobility: Modified Independent                Transfers Overall transfer level: Modified independent Equipment used: Rolling walker (2 wheeled)                Ambulation/Gait Ambulation/Gait assistance: Supervision Ambulation Distance (Feet): 200 Feet Assistive device: Rolling walker (2 wheeled) Gait Pattern/deviations: Step-through pattern;Decreased stride length     General Gait Details: performing well with RW, only cue for turning whole body and not twisting   Stairs Stairs: Yes   Stair Management: Two rails;Alternating pattern;Forwards Number of Stairs: 5 General stair comments: initial verbal cues for technique however pt performed well, x2  Wheelchair Mobility    Modified Rankin (Stroke Patients Only)       Balance                                    Cognition Arousal/Alertness: Awake/alert Behavior During Therapy: WFL for tasks assessed/performed Overall Cognitive Status: Within Functional Limits for tasks assessed                      Exercises Total Joint Exercises Hip ABduction/ADduction: AROM;Right;10 reps;Standing Long Arc Quad: AROM;Right;10 reps;Seated Knee Flexion:  AROM;Right;10 reps;Standing Marching in Standing: AROM;Right;10 reps;Standing    General Comments        Pertinent Vitals/Pain Pain Assessment: 0-10 Pain Score: 2  Pain Location: R hip Pain Descriptors / Indicators: Aching;Sore Pain Intervention(s): Limited activity within patient's tolerance;Monitored during session;Repositioned    Home Living Family/patient expects to be discharged to:: Private residence Living Arrangements: Spouse/significant other Available Help at Discharge: Family Type of Home: House Home Access: Stairs to enter Entrance Stairs-Rails: Right;Left Home Layout: One level Home Equipment: Environmental consultant - 2 wheels;Bedside commode      Prior Function            PT Goals (current goals can now be found in the care plan section) Acute Rehab PT Goals Patient Stated Goal: to walk without a limp Progress towards PT goals: Progressing toward goals    Frequency    7X/week      PT Plan Current plan remains appropriate    Co-evaluation             End of Session   Activity Tolerance: Patient tolerated treatment well Patient left: with call bell/phone within reach;with family/visitor present;in bed     Time: 0903-0920 PT Time Calculation (min) (ACUTE ONLY): 17 min  Charges:  $Therapeutic Exercise: 8-22 mins                    G Codes:      Darrelyn Morro,KATHrine  E 03/26/2016, 12:14 PM Carmelia Bake, PT, DPT 03/26/2016 Pager: [phone]

## 2016-03-28 NOTE — Discharge Summary (Signed)
Physician Discharge Summary  Patient ID: Wesley Colon MRN: [id number] DOB/AGE: Jan 11, 1951 66 y.o.  Admit date: 03/25/2016 Discharge date: 03/26/2016   Procedures:  Procedure(s) (LRB): RIGHT TOTAL HIP ARTHROPLASTY ANTERIOR APPROACH (Right)  Attending Physician:  Dr. Paralee Cancel   Admission Diagnoses:   Right hip primary OA / pain  Discharge Diagnoses:  Principal Problem:   S/P right THA, AA Active Problems:   Obese  Past Medical History:  Diagnosis Date  . Arthritis   . History of bronchitis     HPI:    Wesley Colon, 66 y.o. male, has a history of pain and functional disability in the right hip(s) due to arthritis and patient has failed non-surgical conservative treatments for greater than 12 weeks to include NSAID's and/or analgesics, corticosteriod injections and activity modification.  Onset of symptoms was gradual starting 1.5+ years ago with gradually worsening course since that time.The patient noted no past surgery on the right hip(s).  Patient currently rates pain in the right hip at 10 out of 10 with activity. Patient has night pain, worsening of pain with activity and weight bearing, trendelenberg gait, pain that interfers with activities of daily living and pain with passive range of motion. Patient has evidence of periarticular osteophytes and joint space narrowing by imaging studies. This condition presents safety issues increasing the risk of falls. There is no current active infection.   Risks, benefits and expectations were discussed with the patient.  Risks including but not limited to the risk of anesthesia, blood clots, nerve damage, blood vessel damage, failure of the prosthesis, infection and up to and including death.  Patient understand the risks, benefits and expectations and wishes to proceed with surgery.   PCP: Sherrie Mustache, MD   Discharged Condition: good  Hospital Course:  Patient underwent the above stated procedure on 03/25/2016. Patient  tolerated the procedure well and brought to the recovery room in good condition and subsequently to the floor.  POD #1 BP: 121/70 ; Pulse: 90 ; Temp: 99.4 F (37.4 C) ; Resp: 16 Patient reports pain as mild, pain controlled.  No events throughout the night. Feels that he is progressing well.  Ready to be discharged home.  Dorsiflexion/plantar flexion intact, incision: dressing C/D/I, no cellulitis present and compartment soft.   LABS  Basename    HGB     11.9  HCT     36.2     Discharge Exam: General appearance: alert, cooperative and no distress Extremities: Homans sign is negative, no sign of DVT, no edema, redness or tenderness in the calves or thighs and no ulcers, gangrene or trophic changes  Disposition: Home with follow up in 2 weeks   Follow-up Information    Mauri Pole, MD. Schedule an appointment as soon as possible for a visit in 2 week(s).   Specialty:  Orthopedic Surgery Contact information: [address] [place] [address] [phone]           Discharge Instructions    Call MD / Call 911    Complete by:  As directed    If you experience chest pain or shortness of breath, CALL 911 and be transported to the hospital emergency room.  If you develope a fever above 101 F, pus (white drainage) or increased drainage or redness at the wound, or calf pain, call your surgeon's office.   Change dressing    Complete by:  As directed    Maintain surgical dressing until follow up in the  clinic. If the edges start to pull up, may reinforce with tape. If the dressing is no longer working, may remove and cover with gauze and tape, but must keep the area dry and clean.  Call with any questions or concerns.   Constipation Prevention    Complete by:  As directed    Drink plenty of fluids.  Prune juice may be helpful.  You may use a stool softener, such as Colace (over the counter) 100 mg twice a day.  Use MiraLax (over the counter) for constipation as  needed.   Diet - low sodium heart healthy    Complete by:  As directed    Discharge instructions    Complete by:  As directed    Maintain surgical dressing until follow up in the clinic. If the edges start to pull up, may reinforce with tape. If the dressing is no longer working, may remove and cover with gauze and tape, but must keep the area dry and clean.  Follow up in 2 weeks at [facility]. Call with any questions or concerns.   Increase activity slowly as tolerated    Complete by:  As directed    Weight bearing as tolerated with assist device (walker, cane, etc) as directed, use it as long as suggested by your surgeon or therapist, typically at least 4-6 weeks.   TED hose    Complete by:  As directed    Use stockings (TED hose) for 2 weeks on both leg(s).  You may remove them at night for sleeping.      Allergies as of 03/26/2016   No Known Allergies     Medication List    STOP taking these medications   acetaminophen 500 MG tablet Commonly known as:  TYLENOL   aspirin EC 81 MG tablet Replaced by:  aspirin 81 MG chewable tablet   HYDROcodone-acetaminophen 5-325 MG tablet Commonly known as:  NORCO/VICODIN Replaced by:  HYDROcodone-acetaminophen 7.5-325 MG tablet   ibuprofen 200 MG tablet Commonly known as:  ADVIL,MOTRIN     TAKE these medications   aspirin 81 MG chewable tablet Chew 1 tablet (81 mg total) by mouth 2 (two) times daily. Take for 4 weeks, then resume regular dose. Replaces:  aspirin EC 81 MG tablet   docusate sodium 100 MG capsule Commonly known as:  COLACE Take 1 capsule (100 mg total) by mouth 2 (two) times daily.   ferrous sulfate 325 (65 FE) MG tablet Commonly known as:  FERROUSUL Take 1 tablet (325 mg total) by mouth 3 (three) times daily with meals.   FISH OIL PO Take 950 mg by mouth daily.   HYDROcodone-acetaminophen 7.5-325 MG tablet Commonly known as:  NORCO Take 1-2 tablets by mouth every 4 (four) hours as needed for  moderate pain or severe pain. Replaces:  HYDROcodone-acetaminophen 5-325 MG tablet   methocarbamol 500 MG tablet Commonly known as:  ROBAXIN Take 1 tablet (500 mg total) by mouth every 6 (six) hours as needed for muscle spasms.   OVER THE COUNTER MEDICATION Take 1 capsule by mouth daily. STINGING NETTLE HERBAL SUPPLEMENT   polyethylene glycol packet Commonly known as:  MIRALAX / GLYCOLAX Take 17 g by mouth 2 (two) times daily.   PROBIOTIC PO Take 1 capsule by mouth daily.   PROSTATE SUPPORT PO Take 1 capsule by mouth daily.   TURMERIC PO Take 1 capsule by mouth daily.   Vitamin B-12 5000 MCG Tbdp Take 5,000 mcg by mouth daily.   vitamin C  1000 MG tablet Take 1,000 mg by mouth daily.   Vitamin D3 10000 units Tabs Take 10,000 Units by mouth daily.        Signed: West Pugh. Shahan Starks   PA-C  03/28/2016, 11:51 AM

## 2016-04-14 DIAGNOSIS — E785 Hyperlipidemia, unspecified: Secondary | ICD-10-CM | POA: Diagnosis not present

## 2016-04-14 DIAGNOSIS — Z1159 Encounter for screening for other viral diseases: Secondary | ICD-10-CM | POA: Diagnosis not present

## 2016-05-07 DIAGNOSIS — Z96641 Presence of right artificial hip joint: Secondary | ICD-10-CM | POA: Diagnosis not present

## 2016-05-07 DIAGNOSIS — Z471 Aftercare following joint replacement surgery: Secondary | ICD-10-CM | POA: Diagnosis not present

## 2016-09-08 DIAGNOSIS — H40033 Anatomical narrow angle, bilateral: Secondary | ICD-10-CM | POA: Diagnosis not present

## 2016-09-08 DIAGNOSIS — H2513 Age-related nuclear cataract, bilateral: Secondary | ICD-10-CM | POA: Diagnosis not present

## 2016-10-06 DIAGNOSIS — Z125 Encounter for screening for malignant neoplasm of prostate: Secondary | ICD-10-CM | POA: Diagnosis not present

## 2016-10-06 DIAGNOSIS — E785 Hyperlipidemia, unspecified: Secondary | ICD-10-CM | POA: Diagnosis not present

## 2016-10-06 DIAGNOSIS — Z1159 Encounter for screening for other viral diseases: Secondary | ICD-10-CM | POA: Diagnosis not present

## 2016-10-09 DIAGNOSIS — E785 Hyperlipidemia, unspecified: Secondary | ICD-10-CM | POA: Diagnosis not present

## 2016-10-09 DIAGNOSIS — Z23 Encounter for immunization: Secondary | ICD-10-CM | POA: Diagnosis not present

## 2016-10-09 DIAGNOSIS — Z0000 Encounter for general adult medical examination without abnormal findings: Secondary | ICD-10-CM | POA: Diagnosis not present

## 2016-11-20 DIAGNOSIS — Z23 Encounter for immunization: Secondary | ICD-10-CM | POA: Diagnosis not present

## 2017-04-15 DIAGNOSIS — Z96651 Presence of right artificial knee joint: Secondary | ICD-10-CM | POA: Diagnosis not present

## 2017-04-16 DIAGNOSIS — E785 Hyperlipidemia, unspecified: Secondary | ICD-10-CM | POA: Diagnosis not present

## 2017-05-29 DIAGNOSIS — H40033 Anatomical narrow angle, bilateral: Secondary | ICD-10-CM | POA: Diagnosis not present

## 2017-05-29 DIAGNOSIS — H04123 Dry eye syndrome of bilateral lacrimal glands: Secondary | ICD-10-CM | POA: Diagnosis not present

## 2017-10-20 DIAGNOSIS — E785 Hyperlipidemia, unspecified: Secondary | ICD-10-CM | POA: Diagnosis not present

## 2017-10-21 IMAGING — DX DG HIP (WITH OR WITHOUT PELVIS) 1V PORT*R*
2 series · 2 of 2 positions shown · non-contrast
Comparison: Preoperative study December 04, 2015

CLINICAL DATA: Status post right-sided anterior approach total hip
joint replacement. History of osteoarthritis. Imaging performed in
the [HOSPITAL] PACU.

EXAM:
DG HIP (WITH OR WITHOUT PELVIS) 1V PORT RIGHT

[hip ap]
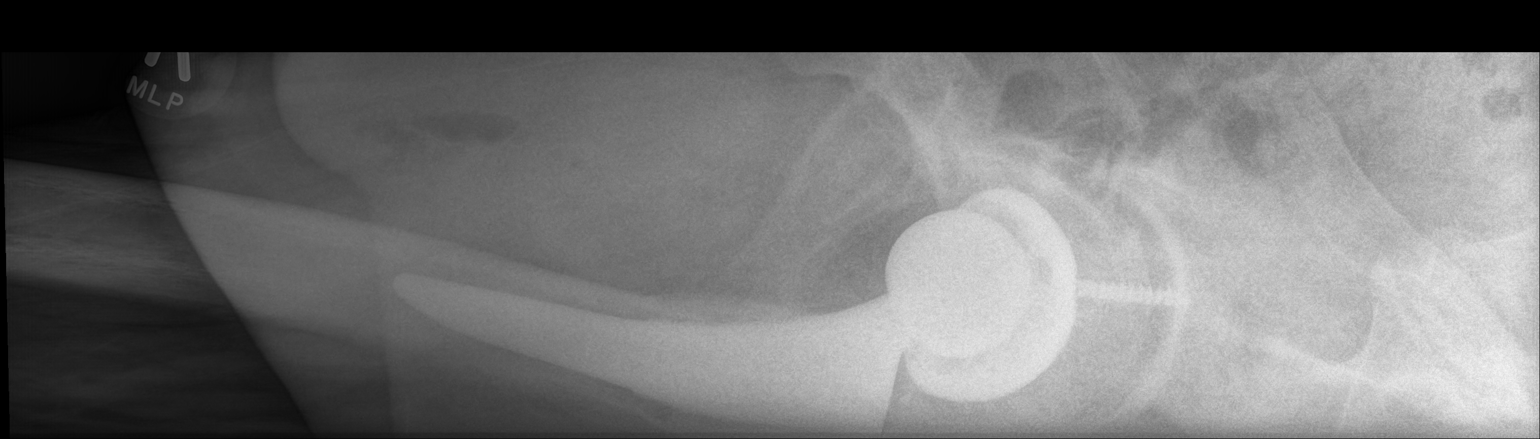

[pelvis ap]
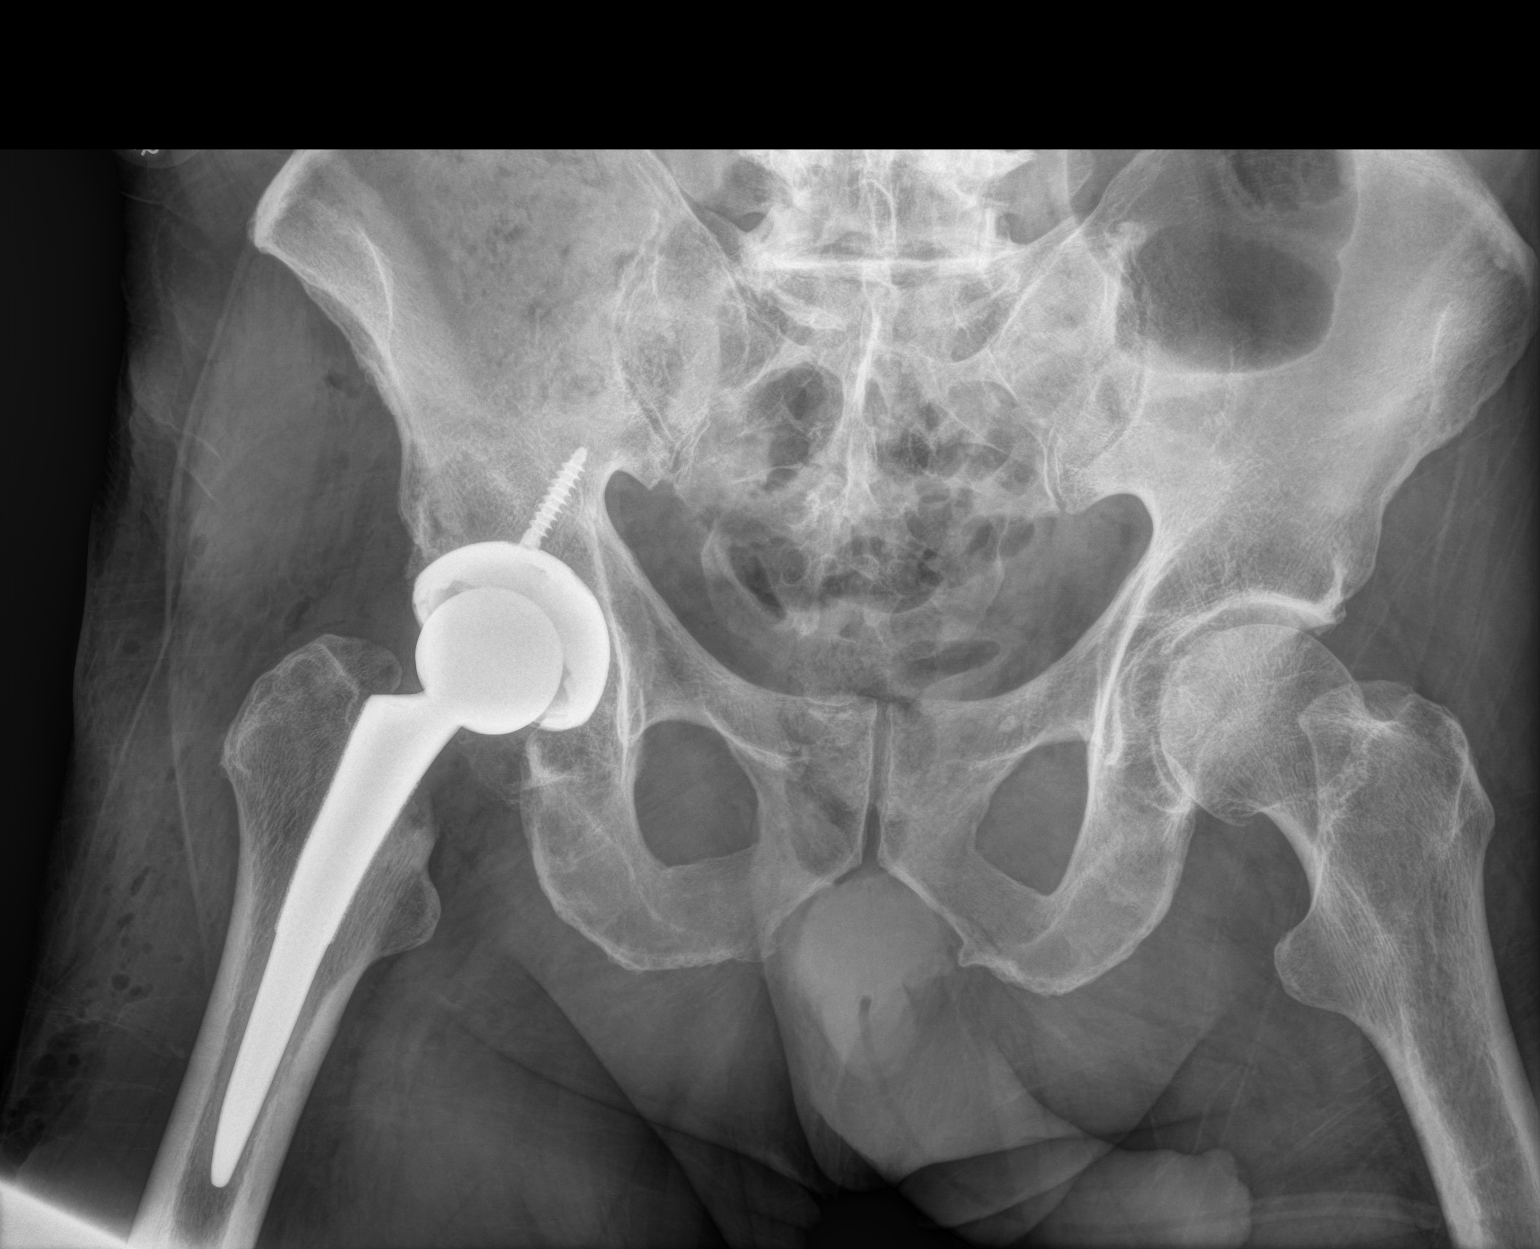

[2 of 2 positions shown; findings below may reference images not displayed]

FINDINGS: The bony pelvis is intact. There has been placement of a prosthetic
right hip joint. The interface of the prosthetic components with the
native bone is normal. The native bone exhibits no acute
abnormality.
IMPRESSION: No immediate postprocedure complication following anterior approach
right total hip joint prosthesis placement. Two attempts were made
by Jeanrobert Sevillano this report directly but the PACU telephone [phone]
remained busy.

These results will be called to the ordering clinician or
representative by the Radiologist Assistant, and communication
documented in the PACS or zVision Dashboard.

## 2017-11-10 DIAGNOSIS — Z125 Encounter for screening for malignant neoplasm of prostate: Secondary | ICD-10-CM | POA: Diagnosis not present

## 2017-11-10 DIAGNOSIS — E785 Hyperlipidemia, unspecified: Secondary | ICD-10-CM | POA: Diagnosis not present

## 2017-11-10 DIAGNOSIS — Z0000 Encounter for general adult medical examination without abnormal findings: Secondary | ICD-10-CM | POA: Diagnosis not present

## 2017-11-19 DIAGNOSIS — Z23 Encounter for immunization: Secondary | ICD-10-CM | POA: Diagnosis not present

## 2017-11-27 DIAGNOSIS — Z1211 Encounter for screening for malignant neoplasm of colon: Secondary | ICD-10-CM | POA: Diagnosis not present

## 2020-02-27 DIAGNOSIS — H04123 Dry eye syndrome of bilateral lacrimal glands: Secondary | ICD-10-CM | POA: Diagnosis not present

## 2020-02-27 DIAGNOSIS — H40033 Anatomical narrow angle, bilateral: Secondary | ICD-10-CM | POA: Diagnosis not present

## 2020-06-18 DIAGNOSIS — N521 Erectile dysfunction due to diseases classified elsewhere: Secondary | ICD-10-CM | POA: Diagnosis not present

## 2020-06-18 DIAGNOSIS — Z125 Encounter for screening for malignant neoplasm of prostate: Secondary | ICD-10-CM | POA: Diagnosis not present

## 2020-06-18 DIAGNOSIS — Z0000 Encounter for general adult medical examination without abnormal findings: Secondary | ICD-10-CM | POA: Diagnosis not present

## 2020-06-18 DIAGNOSIS — Z1211 Encounter for screening for malignant neoplasm of colon: Secondary | ICD-10-CM | POA: Diagnosis not present

## 2020-06-18 DIAGNOSIS — E785 Hyperlipidemia, unspecified: Secondary | ICD-10-CM | POA: Diagnosis not present

## 2020-09-05 DIAGNOSIS — J208 Acute bronchitis due to other specified organisms: Secondary | ICD-10-CM | POA: Diagnosis not present

## 2020-09-05 DIAGNOSIS — B9689 Other specified bacterial agents as the cause of diseases classified elsewhere: Secondary | ICD-10-CM | POA: Diagnosis not present

## 2020-09-05 DIAGNOSIS — J0140 Acute pansinusitis, unspecified: Secondary | ICD-10-CM | POA: Diagnosis not present

## 2020-12-04 DIAGNOSIS — Z86010 Personal history of colonic polyps: Secondary | ICD-10-CM | POA: Diagnosis not present

## 2020-12-04 DIAGNOSIS — D128 Benign neoplasm of rectum: Secondary | ICD-10-CM | POA: Diagnosis not present

## 2020-12-19 DIAGNOSIS — D128 Benign neoplasm of rectum: Secondary | ICD-10-CM | POA: Diagnosis not present

## 2023-05-28 NOTE — Progress Notes (Signed)
 Subjective   Patient ID:  Wesley Colon is a 73 y.o. (DOB 10-Aug-1950) male.      Patient presents with  . Medicare Wellness Visit    Fasting. Wants his viagra to be changed to 50mg  but wants the same amount sent in.     Dental UTD Eye UTD Alcohol No Tobacco No Diet Eat what I want. Eats once a day Exercise walk 40-50 min/day Mood no concerns Sleep occasional snores. Rested ED. Wants to try daily cialis Nocturia 1-2  HTN. Compliant with medications. No side effects ED. Wants to try daily cialis  Social History   Tobacco Use  . Smoking status: Never  . Smokeless tobacco: Never  Vaping Use  . Vaping status: Never Used  Substance Use Topics  . Alcohol use: No  . Drug use: No   Family History  Problem Relation Age of Onset  . Pneumonia Mother   . Heart disease Father        deceased  . Alcohol abuse Father        deceased  . No Known Problems Sister   . Cancer Brother        deceased  . No Known Problems Son   . No Known Problems Sister   . No Known Problems Sister   . Cancer Brother        deceased  . Stroke Brother        deceased   Past Medical History:  Diagnosis Date  . Arthritis    normal for age  . Colon polyp    one removed at colonoscopy  . ED (erectile dysfunction)   . Hyperlipidemia   . Hypertension last visit prescribed medication  . Undescended testicle    Review of Systems is complete and negative except as noted.  Objective   BP 122/72 (BP Location: Left Upper Arm, Patient Position: Sitting)   Pulse 75   Temp 97.2 F (36.2 C) (Skin)   Resp 18   Ht 5' 10 (1.778 m)   Wt 203 lb 9.6 oz (92.4 kg)   SpO2 99%   BMI 29.21 kg/m  General:  Well developed, Well nourished, No distress HEENT: Normocephalic, atraumatic; Pupils equal, round, reactive to light and accommodation; Conjunctiva normal; Bilateral external auditory canals and tympanic membranes normal; Nares normal; Oropharynx moist and clear Neck:  Supple No lymphadenopathy. No  bruit CV:  S1S2, Regular rate and rhythm, without murmur, gallops or rubs Chest. No masses Lungs:  Clear to auscultation bilaterally with normal effort Abdomen. Soft and nontender +diastasis Skin:  No focal rashes  Extremities:  No clubbing, cyanosis or edema.   Neuro:  No focal deficits; Cranial nerves II-XII intact     Impression   1. Medicare annual wellness visit, subsequent   2. Annual physical exam   3. Hypertension, unspecified type   4. BPH associated with nocturia   5. Erectile dysfunction, unspecified erectile dysfunction type   6. Encounter for screening for coronary artery disease     Plan  Reviewed Reviewed goals of BP, weight/BMI and AHA recommendations diet, exercise and alcohol. Check labs Stable. Continue present medical management  Trial cialis Trial cialis Schedule Ct coronary calcium score   Patient's Medications       * Accurate as of May 28, 2023 10:15 AM. Reflects encounter med changes as of last refresh          New Prescriptions      Instructions  tadalafil 5 MG tablet Commonly known as:  CIALIS Started by: Robert Beam  5 mg, Oral, Daily as needed       Continued Medications      Instructions  aspirin  81 mg chewable tablet  81 mg, Daily   losartan potassium 50 mg tablet Commonly known as: COZAAR  50 mg, Oral, Daily       Discontinued Medications    sildenafil citrate 100 mg tablet Commonly known as: VIAGRA Stopped by: Lamar Cornet        Orders Placed This Encounter  Procedures  . CT Cardiac Calcium Scoring  . CBC And Differential  . Comprehensive Metabolic Panel  . Lipid Panel  . PSA    Risks, benefits, and alternatives of the medications and treatment plan prescribed today were discussed, and patient expressed understanding. Plan follow-up as discussed or as needed if any worsening symptoms or change in condition.           Medicare AWV  Wesley Colon is a 73 y.o. male who presents for his subsequent  annual wellness visit for Medicare.  Clinical documentation was reviewed and is accessible via encounter-level attachments.    Any physical exam components or additional concerns beyond the scope of the Annual Wellness Visit may be documented in a separate note within this encounter.  Medicare Required Components     Reviewed and updated this visit by provider: None       Medicare required assessment: Future risk of substance use disorder / overdose risk of 2% is typical (1.3-20%).    Patient Care Team: Lamar MARLA Cornet, MD as PCP - General (Family Medicine)  Vitals   Vitals:   05/28/23 0852  BP: 122/72  Patient Position: Sitting  Pulse: 75  Temp: 97.2 F (36.2 C)  TempSrc: Skin  Resp: 18  Height: 5' 10 (1.778 m)  Weight: 203 lb 9.6 oz (92.4 kg)  SpO2: 99%  BMI (Calculated): 29.2  PainSc: 0-No pain    Disposition   1. Medicare annual wellness visit, subsequent (Primary) 2. Annual physical exam 3. Hypertension, unspecified type -     losartan potassium (COZAAR) 50 mg tablet; Take one tablet (50 mg dose) by mouth daily., Starting Thu 05/28/2023, Normal -     CBC And Differential; Future; Expected date: 05/28/2023 -     Comprehensive Metabolic Panel; Future -     Lipid Panel; Future; Expected date: 07/09/2023 4. BPH associated with nocturia -     PSA; Future 5. Erectile dysfunction, unspecified erectile dysfunction type 6. Encounter for screening for coronary artery disease -     CT Cardiac Calcium Scoring; Future Other orders -     tadalafil (CIALIS) 5 MG tablet; Take one tablet (5 mg dose) by mouth daily as needed., Starting Thu 05/28/2023, Normal   No follow-ups on file.   Health maintenance issues were discussed with the patient.  A written plan was provided to the patient in the form of patient instructions in the After Visit Summary document.     *Some images could not be shown.

## 2024-01-23 ENCOUNTER — Emergency Department (HOSPITAL_BASED_OUTPATIENT_CLINIC_OR_DEPARTMENT_OTHER)

## 2024-01-23 ENCOUNTER — Inpatient Hospital Stay (HOSPITAL_BASED_OUTPATIENT_CLINIC_OR_DEPARTMENT_OTHER)
Admission: EM | Admit: 2024-01-23 | Discharge: 2024-01-25 | DRG: 871 | Disposition: A | Attending: Family Medicine | Admitting: Family Medicine

## 2024-01-23 ENCOUNTER — Other Ambulatory Visit: Payer: Self-pay

## 2024-01-23 ENCOUNTER — Encounter (HOSPITAL_BASED_OUTPATIENT_CLINIC_OR_DEPARTMENT_OTHER): Payer: Self-pay

## 2024-01-23 DIAGNOSIS — E872 Acidosis, unspecified: Secondary | ICD-10-CM | POA: Diagnosis present

## 2024-01-23 DIAGNOSIS — Z1152 Encounter for screening for COVID-19: Secondary | ICD-10-CM | POA: Diagnosis not present

## 2024-01-23 DIAGNOSIS — R55 Syncope and collapse: Secondary | ICD-10-CM

## 2024-01-23 DIAGNOSIS — R6520 Severe sepsis without septic shock: Secondary | ICD-10-CM

## 2024-01-23 DIAGNOSIS — J189 Pneumonia, unspecified organism: Principal | ICD-10-CM | POA: Diagnosis present

## 2024-01-23 DIAGNOSIS — R7989 Other specified abnormal findings of blood chemistry: Secondary | ICD-10-CM

## 2024-01-23 DIAGNOSIS — E663 Overweight: Secondary | ICD-10-CM | POA: Diagnosis present

## 2024-01-23 DIAGNOSIS — Z96641 Presence of right artificial hip joint: Secondary | ICD-10-CM | POA: Diagnosis present

## 2024-01-23 DIAGNOSIS — Z8249 Family history of ischemic heart disease and other diseases of the circulatory system: Secondary | ICD-10-CM | POA: Diagnosis not present

## 2024-01-23 DIAGNOSIS — A419 Sepsis, unspecified organism: Secondary | ICD-10-CM | POA: Insufficient documentation

## 2024-01-23 DIAGNOSIS — Z7982 Long term (current) use of aspirin: Secondary | ICD-10-CM | POA: Diagnosis not present

## 2024-01-23 DIAGNOSIS — R000 Tachycardia, unspecified: Secondary | ICD-10-CM

## 2024-01-23 DIAGNOSIS — Z87891 Personal history of nicotine dependence: Secondary | ICD-10-CM | POA: Diagnosis not present

## 2024-01-23 DIAGNOSIS — Z811 Family history of alcohol abuse and dependence: Secondary | ICD-10-CM | POA: Diagnosis not present

## 2024-01-23 DIAGNOSIS — Z9889 Other specified postprocedural states: Secondary | ICD-10-CM | POA: Diagnosis not present

## 2024-01-23 DIAGNOSIS — Z809 Family history of malignant neoplasm, unspecified: Secondary | ICD-10-CM | POA: Diagnosis not present

## 2024-01-23 DIAGNOSIS — M1990 Unspecified osteoarthritis, unspecified site: Secondary | ICD-10-CM | POA: Diagnosis present

## 2024-01-23 DIAGNOSIS — I959 Hypotension, unspecified: Secondary | ICD-10-CM | POA: Diagnosis present

## 2024-01-23 DIAGNOSIS — Z79899 Other long term (current) drug therapy: Secondary | ICD-10-CM | POA: Diagnosis not present

## 2024-01-23 DIAGNOSIS — Z6827 Body mass index (BMI) 27.0-27.9, adult: Secondary | ICD-10-CM | POA: Diagnosis not present

## 2024-01-23 DIAGNOSIS — D72829 Elevated white blood cell count, unspecified: Secondary | ICD-10-CM

## 2024-01-23 LAB — COMPREHENSIVE METABOLIC PANEL WITH GFR
ALT: 16 U/L (ref 0–44)
AST: 21 U/L (ref 15–41)
Albumin: 4.2 g/dL (ref 3.5–5.0)
Alkaline Phosphatase: 73 U/L (ref 38–126)
Anion gap: 16 — ABNORMAL HIGH (ref 5–15)
BUN: 17 mg/dL (ref 8–23)
CO2: 21 mmol/L — ABNORMAL LOW (ref 22–32)
Calcium: 9.2 mg/dL (ref 8.9–10.3)
Chloride: 102 mmol/L (ref 98–111)
Creatinine, Ser: 1.11 mg/dL (ref 0.61–1.24)
GFR, Estimated: >60 mL/min (ref >60)
Glucose, Bld: 146 mg/dL — ABNORMAL HIGH (ref 70–99)
Potassium: 3.9 mmol/L (ref 3.5–5.1)
Sodium: 139 mmol/L (ref 135–145)
Total Bilirubin: 0.9 mg/dL (ref 0.0–1.2)
Total Protein: 6.9 g/dL (ref 6.5–8.1)

## 2024-01-23 LAB — RESP PANEL BY RT-PCR (FLU A&B, COVID) ARPGX2
Influenza A by PCR: NEGATIVE
Influenza B by PCR: NEGATIVE
SARS Coronavirus 2 by RT PCR: NEGATIVE

## 2024-01-23 LAB — C-REACTIVE PROTEIN: CRP: 12.7 mg/dL — ABNORMAL HIGH (ref <1.0)

## 2024-01-23 LAB — PROTIME-INR
INR: 1.1 (ref 0.8–1.2)
Prothrombin Time: 14.5 s (ref 11.4–15.2)

## 2024-01-23 LAB — SEDIMENTATION RATE: Sed Rate: 5 mm/h (ref 0–16)

## 2024-01-23 LAB — CBC WITH DIFFERENTIAL/PLATELET
Abs Immature Granulocytes: 0.05 K/uL (ref 0.00–0.07)
Basophils Absolute: 0 K/uL (ref 0.0–0.1)
Basophils Relative: 0 %
Eosinophils Absolute: 0 K/uL (ref 0.0–0.5)
Eosinophils Relative: 0 %
HCT: 40.4 % (ref 39.0–52.0)
Hemoglobin: 13.8 g/dL (ref 13.0–17.0)
Immature Granulocytes: 0 %
Lymphocytes Relative: 4 %
Lymphs Abs: 0.5 K/uL — ABNORMAL LOW (ref 0.7–4.0)
MCH: 32.2 pg (ref 26.0–34.0)
MCHC: 34.2 g/dL (ref 30.0–36.0)
MCV: 94.4 fL (ref 80.0–100.0)
Monocytes Absolute: 1.1 K/uL — ABNORMAL HIGH (ref 0.1–1.0)
Monocytes Relative: 8 %
Neutro Abs: 11.9 K/uL — ABNORMAL HIGH (ref 1.7–7.7)
Neutrophils Relative %: 88 %
Platelets: 235 K/uL (ref 150–400)
RBC: 4.28 MIL/uL (ref 4.22–5.81)
RDW: 13.4 % (ref 11.5–15.5)
WBC: 13.1 K/uL — ABNORMAL HIGH (ref 4.0–10.5)
nRBC: 0 % (ref 0.0–0.2)

## 2024-01-23 LAB — LACTIC ACID, PLASMA
Lactic Acid, Venous: 3.6 mmol/L (ref 0.5–1.9)
Lactic Acid, Venous: 4 mmol/L (ref 0.5–1.9)
Lactic Acid, Venous: 2.6 mmol/L (ref 0.5–1.9)

## 2024-01-23 LAB — TROPONIN T, HIGH SENSITIVITY
Troponin T High Sensitivity: 22 ng/L — ABNORMAL HIGH (ref 0–19)
Troponin T High Sensitivity: 26 ng/L — ABNORMAL HIGH (ref 0–19)

## 2024-01-23 MED ORDER — FENTANYL CITRATE (PF) 50 MCG/ML IJ SOSY
25.0000 ug | PREFILLED_SYRINGE | Freq: Once | INTRAMUSCULAR | Status: AC
  Administered 2024-01-23: 25 ug via INTRAVENOUS
  Filled 2024-01-23: qty 1

## 2024-01-23 MED ORDER — KETOROLAC TROMETHAMINE 15 MG/ML IJ SOLN
15.0000 mg | Freq: Four times a day (QID) | INTRAMUSCULAR | Status: AC | PRN
  Administered 2024-01-23 – 2024-01-24 (×3): 15 mg via INTRAVENOUS
  Filled 2024-01-23 (×4): qty 1

## 2024-01-23 MED ORDER — ACETAMINOPHEN 325 MG PO TABS
650.0000 mg | ORAL_TABLET | Freq: Four times a day (QID) | ORAL | Status: DC | PRN
  Administered 2024-01-23 – 2024-01-24 (×2): 650 mg via ORAL
  Filled 2024-01-23 (×2): qty 2

## 2024-01-23 MED ORDER — ONDANSETRON HCL 4 MG/2ML IJ SOLN: 4.0000 mg | Freq: Four times a day (QID) | INTRAMUSCULAR | Status: DC | PRN

## 2024-01-23 MED ORDER — KETOROLAC TROMETHAMINE 15 MG/ML IJ SOLN
15.0000 mg | Freq: Once | INTRAMUSCULAR | Status: AC
  Administered 2024-01-23: 15 mg via INTRAVENOUS
  Filled 2024-01-23: qty 1

## 2024-01-23 MED ORDER — POLYETHYLENE GLYCOL 3350 17 G PO PACK: 17.0000 g | PACK | Freq: Every day | ORAL | Status: DC | PRN

## 2024-01-23 MED ORDER — SODIUM CHLORIDE 0.9 % IV SOLN
2.0000 g | Freq: Once | INTRAVENOUS | Status: AC
  Administered 2024-01-23: 2 g via INTRAVENOUS
  Filled 2024-01-23: qty 20

## 2024-01-23 MED ORDER — ENOXAPARIN SODIUM 40 MG/0.4ML IJ SOSY
40.0000 mg | PREFILLED_SYRINGE | INTRAMUSCULAR | Status: DC
  Administered 2024-01-23 – 2024-01-24 (×2): 40 mg via SUBCUTANEOUS
  Filled 2024-01-23 (×2): qty 0.4

## 2024-01-23 MED ORDER — ACETAMINOPHEN 650 MG RE SUPP: 650.0000 mg | Freq: Four times a day (QID) | RECTAL | Status: DC | PRN

## 2024-01-23 MED ORDER — ONDANSETRON HCL 4 MG PO TABS: 4.0000 mg | ORAL_TABLET | Freq: Four times a day (QID) | ORAL | Status: DC | PRN

## 2024-01-23 MED ORDER — DOCUSATE SODIUM 100 MG PO CAPS: 100.0000 mg | ORAL_CAPSULE | Freq: Two times a day (BID) | ORAL | Status: DC | PRN

## 2024-01-23 MED ORDER — LIDOCAINE 5 % EX PTCH: 1.0000 | MEDICATED_PATCH | Freq: Every day | CUTANEOUS | Status: DC | PRN

## 2024-01-23 MED ORDER — SODIUM CHLORIDE 0.9 % IV SOLN
2.0000 g | INTRAVENOUS | Status: DC
  Administered 2024-01-24: 2 g via INTRAVENOUS
  Filled 2024-01-23: qty 20

## 2024-01-23 MED ORDER — SODIUM CHLORIDE 0.9 % IV SOLN
500.0000 mg | Freq: Once | INTRAVENOUS | Status: AC
  Administered 2024-01-23: 500 mg via INTRAVENOUS
  Filled 2024-01-23: qty 5

## 2024-01-23 MED ORDER — SODIUM CHLORIDE 0.9 % IV SOLN
500.0000 mg | INTRAVENOUS | Status: DC
  Administered 2024-01-24: 500 mg via INTRAVENOUS
  Filled 2024-01-23 (×2): qty 5

## 2024-01-23 MED ORDER — FENTANYL CITRATE (PF) 50 MCG/ML IJ SOSY
50.0000 ug | PREFILLED_SYRINGE | Freq: Once | INTRAMUSCULAR | Status: AC
  Administered 2024-01-23: 50 ug via INTRAVENOUS
  Filled 2024-01-23: qty 1

## 2024-01-23 MED ORDER — LACTATED RINGERS IV BOLUS
1000.0000 mL | Freq: Once | INTRAVENOUS | Status: AC
  Administered 2024-01-23: 1000 mL via INTRAVENOUS

## 2024-01-23 MED ORDER — GUAIFENESIN ER 600 MG PO TB12
600.0000 mg | ORAL_TABLET | Freq: Two times a day (BID) | ORAL | Status: DC
  Administered 2024-01-23 – 2024-01-25 (×4): 600 mg via ORAL
  Filled 2024-01-23 (×4): qty 1

## 2024-01-23 MED ORDER — LACTATED RINGERS IV BOLUS (SEPSIS)
1000.0000 mL | Freq: Once | INTRAVENOUS | Status: AC
  Administered 2024-01-23: 1000 mL via INTRAVENOUS

## 2024-01-23 MED ORDER — SODIUM CHLORIDE 0.9 % IV BOLUS
1000.0000 mL | Freq: Once | INTRAVENOUS | Status: AC
  Administered 2024-01-23: 1000 mL via INTRAVENOUS

## 2024-01-23 MED ORDER — IPRATROPIUM-ALBUTEROL 0.5-2.5 (3) MG/3ML IN SOLN: 3.0000 mL | Freq: Four times a day (QID) | RESPIRATORY_TRACT | Status: DC | PRN

## 2024-01-23 MED ORDER — MELATONIN 5 MG PO TABS
5.0000 mg | ORAL_TABLET | Freq: Every evening | ORAL | Status: DC | PRN
  Administered 2024-01-23: 5 mg via ORAL
  Filled 2024-01-23: qty 1

## 2024-01-23 MED ORDER — IOHEXOL 350 MG/ML SOLN
75.0000 mL | Freq: Once | INTRAVENOUS | Status: AC | PRN
  Administered 2024-01-23: 75 mL via INTRAVENOUS

## 2024-01-23 NOTE — ED Notes (Signed)
 Called [facility] to transport the patient to [facility] rm# [phone]

## 2024-01-23 NOTE — Plan of Care (Signed)
  Problem: Education: Goal: Knowledge of General Education information will improve Description: Including pain rating scale, medication(s)/side effects and non-pharmacologic comfort measures Outcome: Progressing   Problem: Health Behavior/Discharge Planning: Goal: Ability to manage health-related needs will improve Outcome: Progressing   Problem: Clinical Measurements: Goal: Ability to maintain clinical measurements within normal limits will improve Outcome: Not Progressing Goal: Will remain free from infection Outcome: Not Progressing Goal: Diagnostic test results will improve Outcome: Not Progressing Goal: Respiratory complications will improve Outcome: Not Progressing Goal: Cardiovascular complication will be avoided Outcome: Not Progressing   Problem: Activity: Goal: Risk for activity intolerance will decrease Outcome: Not Progressing   Problem: Nutrition: Goal: Adequate nutrition will be maintained Outcome: Not Progressing   Problem: Coping: Goal: Level of anxiety will decrease Outcome: Not Progressing   Problem: Pain Managment: Goal: General experience of comfort will improve and/or be controlled Outcome: Not Progressing

## 2024-01-23 NOTE — H&P (Signed)
 History and Physical   [name] FMW:[id number] DOB: 28-Oct-1950 DOA: 01/23/2024  PCP: Wells Ditch, MD (Inactive) Patient coming from: Urgent care via POV  Referring provider: Dr. Charlyn Telemedicine provider: Dr. Sherre Patient location: [facility] emergency department at [facility] Referring diagnosis: Coughing, community-acquired pneumonia Patient name and DOB verified: At bedside, patient was able to verify his name: Wesley Colon and date of birth: [birth year]. Patient consented to Telemedicine Evaluation: Yes, verbal consent was given RN virtual assistant: Montine Blush, RN Video encounter time and date: 01/23/24 at approximately 1600.  Chief Concern: syncope  HPI: Wesley Colon is a 73 year old male with no prior medical diagnosis, who presents emergency department from urgent care for chief concerns of syncope.  12/6: Patient into the ED  At the time of my evaluation, patient had t of 98.4, rr 16, hr 117, blood pressure 105/60, MAP of 75, SpO2 of 94% on room air.  Serum sodium is 139, potassium 3.9, chloride 102, bicarb 21, BUN 17, serum creatinine 1.11, eGFR greater than 60, nonfasting blood glucose 146, WBC 13.1, hemoglobin 13.8, platelets of 235.  Influenza A/instance B/COVID were negative. Lactic acid was initially 3.6 and on repeat was 4.0. HS troponin was initially 22 and on repeat is 26.  ED treatment: Fentanyl  50 mcg IV one-time dose, Toradol  15 mg IV one-time dose, azithromycin  500 mg IV one-time dose, ceftriaxone  2 g IV one-time dose, sodium chloride  1 L bolus, LR 1 L bolus.  01/23/24: Patient admitted to hospitalist service for chief concerns of right lower quadrant for community acquired pneumonia. ------------------------------------- At bedside, via virtual telemedicine encounter, patient was able to confirm his first and last name, his age, his date of birth, and his spouse at bedside.  He reports that he has been feeling unwell with  increased coughing since last evening.  He reports he presented to urgent care and while at pharmacy, he had an episode of syncope.  He got into the car in order to drive to the ED and in the car he had an episode of syncope.  His spouse at bedside states that he lost consciousness for approximately 2 to 3 minutes.  Spouse at bedside denies any head trauma.  He reports subjective chills and diaphoresis last evening.  He denies any fever.  He endorses vomiting x 2 last night and states the liquid was brown as he drank Diet Coke.  He denies shortness of breath, chest pain, abdominal pain, dysuria, hematuria, diarrhea, blood in his urine or stool.  Social history: He lives at home with his wife.  He denies tobacco, EtOH, recreational drug use.  He still works as a careers adviser.  ROS: Constitutional: no weight change, no fever ENT/Mouth: no sore throat, no rhinorrhea Eyes: no eye pain, no vision changes Cardiovascular: no chest pain, no dyspnea,  no edema, no palpitations Respiratory: + cough, no sputum, no wheezing Gastrointestinal: no nausea, no vomiting, no diarrhea, no constipation Genitourinary: no urinary incontinence, no dysuria, no hematuria Musculoskeletal: no arthralgias, no myalgias Skin: no skin lesions, no pruritus, Neuro: + weakness, + loss of consciousness, + syncope Psych: no anxiety, no depression, no decrease appetite Heme/Lymph: no bruising, no bleeding  ED Course: Discussed with EDP, patient requiring hospitalization for chief concerns of community-acquired pneumonia in the right lower lobe.  Assessment/Plan  Principal Problem:   Community acquired pneumonia Active Problems:   SIRS due to infectious process with acute organ dysfunction (HCC)   Leukocytosis   Elevated lactic acid level  Overweight (BMI 25.0-29.9)   Sinus tachycardia   Assessment and Plan:  * Community acquired pneumonia Continue with azithromycin  500 mg IV daily, ceftriaxone  2 g IV daily to  complete a 5-day course Incentive spirometry, flutter valve q2h while awake Maintain SpO2 > 92 percent Maintain MAP g> 65 Check MRSA PCR Aspiration precautions  SIRS due to infectious process with acute organ dysfunction [facility]) Patient had increased heart rate, leukocytosis, neurological involvement with syncope x 2, and source of infection of right lower lobe pneumonia Sepsis can not be exclude at this time on admission Blood cx x2 are in process Continue treatment with IV abx per above Maintain MAP > 65  Elevated lactic acid level Blood cx x 2 are in process 3.6 and increased to 4.0 and on third check was 2.6  Leukocytosis Secondary to CAP, treatment per above Recheck CBC in the AM  Sinus tachycardia Secondary to CAP, continue to monitor with telemetry Continue treatment per above  Chart reviewed.   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 His smoking use included cigarettes. He started smoking about 61 years ago. He has a 6 pack-year smoking history. He has never used smokeless tobacco. He reports that he does not drink alcohol and does not use drugs.  No Known Allergies Family History  Problem Relation Age of Onset   Heart disease Father    Alcohol abuse Father    Cancer  Brother    Family history: Family history reviewed and not pertinent.  Prior to Admission medications   Medication Sig Start Date End Date Taking? Authorizing Provider  Ascorbic Acid (VITAMIN C) 1000 MG tablet Take 1,000 mg by mouth daily.    [provider]  Cholecalciferol (VITAMIN D3) 10000 units TABS Take 10,000 Units by mouth daily.    [provider]  Cyanocobalamin (VITAMIN B-12) 5000 MCG TBDP Take 5,000 mcg by mouth daily.    [provider]  docusate sodium  (COLACE) 100 MG capsule Take 1 capsule (100 mg total) by mouth 2 (two) times daily. 03/25/16   Danella Donnice, PA-C  ferrous sulfate  (FERROUSUL) 325 (65 FE) MG tablet Take 1 tablet (325 mg total) by mouth 3 (three) times daily with meals. 03/25/16   Danella Donnice, PA-C  HYDROcodone -acetaminophen  (NORCO) 7.5-325 MG tablet Take 1-2 tablets by mouth every 4 (four) hours as needed for moderate pain or severe pain. 03/25/16   Danella Donnice, PA-C  methocarbamol  (ROBAXIN ) 500 MG tablet Take 1 tablet (500 mg total) by mouth every 6 (six) hours as needed for muscle spasms. 03/25/16   Danella Donnice, PA-C  Misc Natural Products (PROSTATE SUPPORT PO) Take 1 capsule by mouth daily.    [provider]  Omega-3 Fatty Acids (FISH OIL PO) Take 950 mg by  mouth daily.    [provider]  OVER THE COUNTER MEDICATION Take 1 capsule by mouth daily. STINGING NETTLE HERBAL SUPPLEMENT    [provider]  polyethylene glycol (MIRALAX  / GLYCOLAX ) packet Take 17 g by mouth 2 (two) times daily. 03/25/16   Danella Cough, PA-C  Probiotic Product (PROBIOTIC PO) Take 1 capsule by mouth daily.    [provider]  TURMERIC PO Take 1 capsule by mouth daily.    [provider]    Physical Exam: Vitals:   01/23/24 1633 01/23/24 1700 01/23/24 1725 01/23/24 1823  BP:  105/67  129/71  Pulse:  (!) 118  (!) 116  Resp:  (!) 23  16  Temp: 98.3 F (36.8 C)   98.6 F (37 C)  TempSrc: Oral   Oral   SpO2:   95% 96%  Weight:      Height:       Physical exam was completed with the assistance of Montine Blush, RN, who was present in the room during this portion of the encounter.  Constitutional: appears age appropriate, mildly uncomfortable and ill Eyes: PERRL, lids and conjunctivae normal ENMT: Mucous membranes are moist. Hearing appropriate Neck: normal, midline Respiratory: clear to auscultation bilaterally.  Mild increase respiratory effort. Mild accessory muscle use.  Cardiovascular: Regular rate and rhythm, no murmurs. No extremity edema.  Abdomen: no tenderness, no masses palpated, no hepatosplenomegaly. Bowel sounds positive.  Musculoskeletal: no clubbing / cyanosis. No joint deformity upper and lower extremities. Good ROM, no contractures, no atrophy. Normal muscle tone.  Skin: of visible skin, no rashes were noted Neurologic: Sensation intact. Strength appropriate all 4.  Psychiatric: Normal judgment and insight. Alert and oriented x 3. Normal mood.   EKG: independently reviewed, showing sinus tachycardia with rate of 111, QTc 441  Chest x-ray on Admission: I personally reviewed and I agree with radiologist reading as below.  CT Angio Chest PE W and/or Wo Contrast Result Date: 01/23/2024 CLINICAL DATA:  Right-sided chest pain and cough EXAM: CT ANGIOGRAPHY CHEST WITH CONTRAST TECHNIQUE: Multidetector CT imaging of the chest was performed using the standard protocol during bolus administration of intravenous contrast. Multiplanar CT image reconstructions and MIPs were obtained to evaluate the vascular anatomy. RADIATION DOSE REDUCTION: This exam was performed according to the departmental dose-optimization program which includes automated exposure control, adjustment of the mA and/or kV according to patient size and/or use of iterative reconstruction technique. CONTRAST:  75mL OMNIPAQUE  IOHEXOL  350 MG/ML SOLN COMPARISON:  01/23/2024 FINDINGS: Cardiovascular: This is a  technically adequate evaluation of the pulmonary vasculature. No filling defects or pulmonary emboli. The heart is unremarkable without pericardial effusion. No evidence of thoracic aortic aneurysm or dissection. Mediastinum/Nodes: No enlarged mediastinal, hilar, or axillary lymph nodes. Thyroid  gland, trachea, and esophagus demonstrate no significant findings. Lungs/Pleura: Since the previous chest x-ray, dense right lower lobe airspace disease has developed, consistent with aspiration, infection, or hemorrhage. No effusion or pneumothorax. The central airways are patent. Upper Abdomen: No acute abnormality. Musculoskeletal: No acute or destructive bony abnormalities. Reconstructed images demonstrate no additional findings. Review of the MIP images confirms the above findings. IMPRESSION: 1. No evidence of pulmonary embolus. 2. Interval development of dense right lower lobe airspace disease since the preceding chest x-ray, consistent with aspiration, infection, or hemorrhage. Electronically Signed   By: Ozell Daring M.D.   On: 01/23/2024 13:56   DG Chest Portable 1 View Result Date: 01/23/2024 CLINICAL DATA:  Right-sided chest pain, cough EXAM: PORTABLE CHEST 1 VIEW  COMPARISON:  None Available. FINDINGS: The heart size and mediastinal contours are within normal limits. Both lungs are clear. The visualized skeletal structures are unremarkable. IMPRESSION: No active disease. Electronically Signed   By: Ozell Daring M.D.   On: 01/23/2024 13:15   Labs on Admission: I have personally reviewed following labs  CBC: Recent Labs  Lab 01/23/24 1220  WBC 13.1*  NEUTROABS 11.9*  HGB 13.8  HCT 40.4  MCV 94.4  PLT 235   Basic Metabolic Panel: Recent Labs  Lab 01/23/24 1220  NA 139  K 3.9  CL 102  CO2 21*  GLUCOSE 146*  BUN 17  CREATININE 1.11  CALCIUM 9.2   GFR: Estimated Creatinine Clearance: 62.2 mL/min (by C-G formula based on SCr of 1.11 mg/dL).  Liver Function Tests: Recent Labs  Lab  01/23/24 1220  AST 21  ALT 16  ALKPHOS 73  BILITOT 0.9  PROT 6.9  ALBUMIN 4.2   Coagulation Profile: Recent Labs  Lab 01/23/24 1220  INR 1.1   This document was prepared using Dragon Voice Recognition software and may include unintentional dictation errors.  Dr. Sherre [facility] Location: [facility]  If 7PM-7AM, please contact overnight-coverage provider If 7AM-7PM, please contact day attending provider [email or web address]  01/23/2024, 6:37 PM

## 2024-01-23 NOTE — ED Notes (Signed)
 He tells me he continues to feel much better. Also, he has ambulated to b.r. and back without any c/o dizziness or lightheadedness.

## 2024-01-23 NOTE — Assessment & Plan Note (Signed)
 Blood cx x 2 are in process 3.6 and increased to 4.0 and on third check was 2.6

## 2024-01-23 NOTE — Assessment & Plan Note (Addendum)
 Secondary to CAP, continue to monitor with telemetry Continue treatment per above

## 2024-01-23 NOTE — Plan of Care (Signed)
 Patient seen at bedside after transfer from [facility].  Initially seen as virtual admission by Dr. Sherre.  73 year old male with history of HTN admitted with community acquired pneumonia of the right lower lobe.  He is starting to feel better, saturating well on room air.  Blood pressure 129/71, pulse (!) 116, temperature 98.6 F (37 C), temperature source Oral, resp. rate 16, height 5' 11 (1.803 m), weight 89.4 kg, SpO2 96%.   General: resting in bed, no acute distress HEENT: , EOMI, no scleral icterus Cardiac: Mild tachycardia, no rubs, murmurs or gallops Pulm: Inspiratory crackles right lower lobe Abd: soft, nontender, nondistended Ext: warm and well perfused, no pedal edema Neuro: alert and oriented X3, strength equal bilaterally  Continue antibiotics and plan of care as ordered by admitting provider.

## 2024-01-23 NOTE — Sepsis Progress Note (Signed)
 Elink following code sepsis

## 2024-01-23 NOTE — Hospital Course (Signed)
 Mr. Wesley Colon is a 73 year old male with no prior medical diagnosis, who presents emergency department from urgent care for chief concerns of syncope.  12/6: Patient into the ED  At the time of my evaluation, patient had t of 98.4, rr 16, hr 117, blood pressure 105/60, MAP of 75, SpO2 of 94% on room air.  Serum sodium is 139, potassium 3.9, chloride 102, bicarb 21, BUN 17, serum creatinine 1.11, eGFR greater than 60, nonfasting blood glucose 146, WBC 13.1, hemoglobin 13.8, platelets of 235.  Influenza A/instance B/COVID were negative. Lactic acid was initially 3.6 and on repeat was 4.0. HS troponin was initially 22 and on repeat is 26.  ED treatment: Fentanyl  50 mcg IV one-time dose, Toradol  15 mg IV one-time dose, azithromycin  500 mg IV one-time dose, ceftriaxone  2 g IV one-time dose, sodium chloride  1 L bolus, LR 1 L bolus.  01/23/24: Patient admitted to hospitalist service for chief concerns of right lower quadrant for community acquired pneumonia.

## 2024-01-23 NOTE — Assessment & Plan Note (Signed)
 Secondary to CAP, treatment per above Recheck CBC in the AM

## 2024-01-23 NOTE — ED Triage Notes (Addendum)
 Arrives ambulatory to the ED with complaints of having two syncopal episodes today. Patient also reports developing a cough/cold symptoms x2 days as well. Sent here by urgent care for further due to having hypotension (95/53). Negative for covid/flu today as well.   Also woke up with right side pain as well.

## 2024-01-23 NOTE — ED Notes (Signed)
 I have just called report to Amy, RN at [facility].

## 2024-01-23 NOTE — ED Notes (Signed)
 CRITICAL VALUE STICKER  CRITICAL VALUE:Lactate 3.6  RECEIVER (on-site recipient of call):ONEIDA Sharps, RN  DATE & TIME NOTIFIED:   MESSENGER (representative from lab):  MD NOTIFIED: Nanavati  TIME OF NOTIFICATION:  RESPONSE:

## 2024-01-23 NOTE — ED Notes (Signed)
 Plan to draw the extant lactate after current liter of IV fluid has been administered.

## 2024-01-23 NOTE — Assessment & Plan Note (Addendum)
 Patient had increased heart rate, leukocytosis, neurological involvement with syncope x 2, and source of infection of right lower lobe pneumonia Sepsis can not be exclude at this time on admission Blood cx x2 are in process Continue treatment with IV abx per above Maintain MAP > 65

## 2024-01-23 NOTE — ED Notes (Signed)
 Patient transported to CT

## 2024-01-23 NOTE — ED Provider Notes (Addendum)
 [facility] AT [facility] Provider Note   CSN: [id number] Arrival date & time: 01/23/24  1113     Patient presents with: Loss of Consciousness, Cough, and Hypotension   Wesley Colon is a 73 y.o. male.   HPI     73 year old male comes in with chief complaint of right-sided chest pain and fainting episode.  Patient is accompanied by wife.  According to the patient, yesterday evening he started developing cold-like symptoms.  He was having chills and sweats.  Overnight he started noticing some chest pain and he could not sleep well.  This morning his chest pain is severe.  The pain is worse with any cough or breathing.  The pain is right-sided, anterolateral in the lower chest area.  Patient went to urgent care today.  They gave him Tamiflu.  While at the urgent care, patient passed out.  Patient states that he did not have any chest pain, palpitations, shortness of breath, but he did get clammy.  Wife states that at that time she decided to just bring him to the ER, and while they were driving here, patient fainted again for 3 to 5 minutes.  Patient does not have any cardiac disease history.  He denies any recent travels, there is no history of PE, DVT and no risk factors for the same.  No recent surgeries.  Prior to Admission medications   Medication Sig Start Date End Date Taking? Authorizing Provider  Ascorbic Acid (VITAMIN C) 1000 MG tablet Take 1,000 mg by mouth daily.    [provider]  Cholecalciferol (VITAMIN D3) 10000 units TABS Take 10,000 Units by mouth daily.    [provider]  Cyanocobalamin (VITAMIN B-12) 5000 MCG TBDP Take 5,000 mcg by mouth daily.    [provider]  docusate sodium  (COLACE) 100 MG capsule Take 1 capsule (100 mg total) by mouth 2 (two) times daily. 03/25/16   Danella Cough, PA-C  ferrous sulfate  (FERROUSUL) 325 (65 FE) MG tablet Take 1 tablet (325 mg total) by mouth 3 (three) times daily with  meals. 03/25/16   Danella Cough, PA-C  HYDROcodone -acetaminophen  (NORCO) 7.5-325 MG tablet Take 1-2 tablets by mouth every 4 (four) hours as needed for moderate pain or severe pain. 03/25/16   Danella Cough, PA-C  methocarbamol  (ROBAXIN ) 500 MG tablet Take 1 tablet (500 mg total) by mouth every 6 (six) hours as needed for muscle spasms. 03/25/16   Danella Cough, PA-C  Misc Natural Products (PROSTATE SUPPORT PO) Take 1 capsule by mouth daily.    [provider]  Omega-3 Fatty Acids (FISH OIL PO) Take 950 mg by mouth daily.    [provider]  OVER THE COUNTER MEDICATION Take 1 capsule by mouth daily. STINGING NETTLE HERBAL SUPPLEMENT    [provider]  polyethylene glycol (MIRALAX  / GLYCOLAX ) packet Take 17 g by mouth 2 (two) times daily. 03/25/16   Danella Cough, PA-C  Probiotic Product (PROBIOTIC PO) Take 1 capsule by mouth daily.    [provider]  TURMERIC PO Take 1 capsule by mouth daily.    [provider]    Allergies: Patient has no known allergies.    Review of Systems  All other systems reviewed and are negative.   Updated Vital Signs BP (!) 87/49   Pulse (!) 114   Temp 98.4 F (36.9 C)   Resp 16   Ht 5' 11 (1.803 m)   Wt 89.4 kg   SpO2 94%  BMI 27.48 kg/m   Physical Exam Vitals and nursing note reviewed.  Constitutional:      Appearance: He is well-developed.  HENT:     Head: Atraumatic.  Eyes:     Extraocular Movements: Extraocular movements intact.     Pupils: Pupils are equal, round, and reactive to light.  Cardiovascular:     Rate and Rhythm: Tachycardia present.  Pulmonary:     Effort: Pulmonary effort is normal.     Comments: Right lower rhonchi Musculoskeletal:     Cervical back: Neck supple.  Skin:    General: Skin is warm.  Neurological:     Mental Status: He is alert and oriented to person, place, and time.     (all labs ordered are listed, but only abnormal results are displayed) Labs Reviewed   LACTIC ACID, PLASMA - Abnormal; Notable for the following components:      Result Value   Lactic Acid, Venous 3.6 (*)    All other components within normal limits  LACTIC ACID, PLASMA - Abnormal; Notable for the following components:   Lactic Acid, Venous 4.0 (*)    All other components within normal limits  COMPREHENSIVE METABOLIC PANEL WITH GFR - Abnormal; Notable for the following components:   CO2 21 (*)    Glucose, Bld 146 (*)    Anion gap 16 (*)    All other components within normal limits  CBC WITH DIFFERENTIAL/PLATELET - Abnormal; Notable for the following components:   WBC 13.1 (*)    Neutro Abs 11.9 (*)    Lymphs Abs 0.5 (*)    Monocytes Absolute 1.1 (*)    All other components within normal limits  TROPONIN T, HIGH SENSITIVITY - Abnormal; Notable for the following components:   Troponin T High Sensitivity 22 (*)    All other components within normal limits  TROPONIN T, HIGH SENSITIVITY - Abnormal; Notable for the following components:   Troponin T High Sensitivity 26 (*)    All other components within normal limits  RESP PANEL BY RT-PCR (FLU A&B, COVID) ARPGX2  CULTURE, BLOOD (ROUTINE X 2)  CULTURE, BLOOD (ROUTINE X 2)  PROTIME-INR  SEDIMENTATION RATE  C-REACTIVE PROTEIN  LACTIC ACID, PLASMA  LACTIC ACID, PLASMA    EKG: EKG Interpretation Date/Time:  Saturday January 23 2024 11:29:21 EST Ventricular Rate:  111 PR Interval:  164 QRS Duration:  95 QT Interval:  324 QTC Calculation: 441 R Axis:   52  Text Interpretation: Sinus tachycardia No acute changes No old tracing to compare Confirmed by Charlyn Sora [phone]) on 01/23/2024 12:19:06 PM  Radiology: CT Angio Chest PE W and/or Wo Contrast Result Date: 01/23/2024 CLINICAL DATA:  Right-sided chest pain and cough EXAM: CT ANGIOGRAPHY CHEST WITH CONTRAST TECHNIQUE: Multidetector CT imaging of the chest was performed using the standard protocol during bolus administration of intravenous contrast. Multiplanar  CT image reconstructions and MIPs were obtained to evaluate the vascular anatomy. RADIATION DOSE REDUCTION: This exam was performed according to the departmental dose-optimization program which includes automated exposure control, adjustment of the mA and/or kV according to patient size and/or use of iterative reconstruction technique. CONTRAST:  75mL OMNIPAQUE  IOHEXOL  350 MG/ML SOLN COMPARISON:  01/23/2024 FINDINGS: Cardiovascular: This is a technically adequate evaluation of the pulmonary vasculature. No filling defects or pulmonary emboli. The heart is unremarkable without pericardial effusion. No evidence of thoracic aortic aneurysm or dissection. Mediastinum/Nodes: No enlarged mediastinal, hilar, or axillary lymph nodes. Thyroid  gland, trachea, and esophagus demonstrate no significant findings. Lungs/Pleura: Since  the previous chest x-ray, dense right lower lobe airspace disease has developed, consistent with aspiration, infection, or hemorrhage. No effusion or pneumothorax. The central airways are patent. Upper Abdomen: No acute abnormality. Musculoskeletal: No acute or destructive bony abnormalities. Reconstructed images demonstrate no additional findings. Review of the MIP images confirms the above findings. IMPRESSION: 1. No evidence of pulmonary embolus. 2. Interval development of dense right lower lobe airspace disease since the preceding chest x-ray, consistent with aspiration, infection, or hemorrhage. Electronically Signed   By: Ozell Daring M.D.   On: 01/23/2024 13:56   DG Chest Portable 1 View Result Date: 01/23/2024 CLINICAL DATA:  Right-sided chest pain, cough EXAM: PORTABLE CHEST 1 VIEW COMPARISON:  None Available. FINDINGS: The heart size and mediastinal contours are within normal limits. Both lungs are clear. The visualized skeletal structures are unremarkable. IMPRESSION: No active disease. Electronically Signed   By: Ozell Daring M.D.   On: 01/23/2024 13:15     .Critical  Care  Performed by: Charlyn Sora, MD Authorized by: Charlyn Sora, MD   Critical care provider statement:    Critical care time (minutes):  48   Critical care was necessary to treat or prevent imminent or life-threatening deterioration of the following conditions:  Sepsis and circulatory failure   Critical care was time spent personally by me on the following activities:  Development of treatment plan with patient or surrogate, discussions with consultants, evaluation of patient's response to treatment, examination of patient, ordering and review of laboratory studies, ordering and review of radiographic studies, ordering and performing treatments and interventions, pulse oximetry, re-evaluation of patient's condition, review of old charts and obtaining history from patient or surrogate    Medications Ordered in the ED  azithromycin  (ZITHROMAX ) 500 mg in sodium chloride  0.9 % 250 mL IVPB (500 mg Intravenous New Bag/Given 01/23/24 1511)  lactated ringers  bolus 1,000 mL (has no administration in time range)  lactated ringers  bolus 1,000 mL (0 mLs Intravenous Stopped 01/23/24 1428)  fentaNYL  (SUBLIMAZE ) injection 50 mcg (50 mcg Intravenous Given 01/23/24 1238)  ketorolac  (TORADOL ) 15 MG/ML injection 15 mg (15 mg Intravenous Given 01/23/24 1332)  iohexol  (OMNIPAQUE ) 350 MG/ML injection 75 mL (75 mLs Intravenous Contrast Given 01/23/24 1338)  cefTRIAXone  (ROCEPHIN ) 2 g in sodium chloride  0.9 % 100 mL IVPB (0 g Intravenous Stopped 01/23/24 1553)  sodium chloride  0.9 % bolus 1,000 mL (1,000 mLs Intravenous New Bag/Given 01/23/24 1508)                                    Medical Decision Making Amount and/or Complexity of Data Reviewed Labs: ordered. Radiology: ordered.  Risk Prescription drug management. Decision regarding hospitalization.   This patient presents to the ED with chief complaint(s) of cough, syncope, flulike symptoms with pertinent past medical history of no cardiac disease  history.The complaint involves an extensive differential diagnosis and also carries with it a high risk of complications and morbidity.  Of note, patient went to urgent care prior to ED arrival.  Over there he had negative flu test.  He did have blood pressure in the 90s systolic while there.  And then he had 2 syncopal episodes after being discharged from the urgent care.  The differential diagnosis includes : Severe electrolyte abnormality, severe dehydration, renal failure, flu/COVID, pneumonia, PE, arrhythmia  Patient is complaining of pleuritic chest pain on the right side with severe cough.  He is having rigors, sweats.  High suspicion for underlying infectious process.  Pulmonary infarction along with PE also a possibility.  Patient does not carry any risk factors for aortic aneurysm or dissection.  The initial plan is to get basic labs, x-ray of the chest.  If the x-ray of the chest is reassuring, then we will proceed with CT angio chest PE.   Additional history obtained: Additional history obtained from family Records reviewed Care Everywhere/External Records  Independent labs interpretation:  The following labs were independently interpreted: Patient's initial lactate is over 3.  IV fluids ordered.  Independent visualization and interpretation of imaging: - I independently visualized the following imaging with scope of interpretation limited to determining acute life threatening conditions related to emergency care: X-ray of the chest, which revealed no large focal consolidation.  I proceeded with CT angio chest PE, I have independently interpreted patient's CT chest, there is consolidation in the right lung field.  Per radiologist, this could be because of hemorrhage versus pneumonia.  Patient's white count is 13.  He is not on any blood thinners.  There is no pulmonary infarction or PE.  Patient does not have hemoptysis.  At this time, I have higher suspicion for pneumonia than  pulmonary hemorrhage.  Treatment and Reassessment: Patient received IV fentanyl .  Pain was slightly better, but then it returned.  IV Toradol  ordered.  On repeat assessment, blood pressure remains in the 100 remains systolic, heart rate in the 120s.   4:04 PM Patient's lactate is rising. His blood pressure does drop.  I will give him another bolus.  He would have received 30 cc/kg fluid with the third bolus.  Patient's sepsis hemodynamic reassessment completed at 4 PM.  Patient made aware that he will be admitted.  Final diagnoses:  Community acquired pneumonia of right lower lobe of lung  Syncope and collapse  Severe sepsis [facility])    ED Discharge Orders     None          Charlyn Sora, MD 01/23/24 1448    Charlyn Sora, MD 01/23/24 [phone]

## 2024-01-23 NOTE — ED Notes (Signed)
 He tells me he is much more comfortable after having received the IV Toradol . Also, his rigors have ceased. His wife remains at bedside. Will obtain 2nd lactate after IV bolus complete.

## 2024-01-23 NOTE — Progress Notes (Signed)
 Assessment:     1. Influenza   2. Runny nose   3. Tachycardia   4. Fever, unspecified fever cause       Plan:   1. Labs and orders from today's visit: Orders Placed This Encounter  Procedures  . POCT CoVid-19 nucleic acid  . POCT Flu Nucleic Acid   Encounter Medications[1] Monitor blood pressure. Aloe vera juice or hot tea with honey and lemon juice as needed for sore throat. Over the counter antihistamine (fexofenadine, loratadine, cetirizine or levocetirizine) as needed for runny nose. Nasal steroid (fluticasone or nasacort) as needed for nasal congestion. Robitussin DM as needed for cough. Epsom salts bath as needed for body aches. OTC Tylenol  and/or NSAID as needed.   Follow up with your PCP in 3-5 days, sooner if worse or if new symptoms.   Go to the ED if worse.  Previsit planning was completed via snapshot and review of chart.  I reviewed the patient instructions on the AVS with the patient/family verbalized to me that they understood what their problem is, what they need to do about it, and why it is important that they do it.  The patient/family voices understanding of all medications. No barriers to adherence were noted. Patient is taking all medications as prescribed and is tolerating well.  Plan for follow-up as discussed or as needed if any worsening symptoms or change in condition.  After Visit Summary was given to patient/or sent to MyChart.     Newell Niels Grew, NP    Subjective:    Wesley Colon is a 73 y.o. male who presents for runny nose, fatigue, chills, headache, myalgias, productive cough and fever and have been present for 12 hours.   He had difficulty sleeping last night.  He has had 2 episodes of vomiting this morning. He denies chest pain, shortness of breath, wheezing, difficulty breathing. No history of asthma. Past Medical History[2]  Allergies[3]   Social History[4]   Objective:   BP (!) 95/53 (BP Location: Left Upper Arm,  Patient Position: Sitting) Comment: 97/53 left arm  Pulse (!) 123   Temp 99.5 F (37.5 C) (Tympanic)   Resp 18   Ht 5' 11 (1.803 m)   Wt 197 lb (89.4 kg)   SpO2 97%   BMI 27.48 kg/m  General appearance: alert, appears stated age, cooperative and no distress Head: Normocephalic, without obvious abnormality, atraumatic Eyes: conjunctivae/corneas clear. PERRL, EOM's intact. Ears: normal TM's and external ear canals both ears Nose: Nares normal. Septum midline. Mucosa normal. No drainage or sinus tenderness. Throat: lips, mucosa, and tongue normal; teeth and gums normal Neck: no adenopathy, no carotid bruit, no JVD, supple, symmetrical, trachea midline and thyroid  not enlarged, symmetric, no tenderness/mass/nodules Lungs: clear to auscultation bilaterally Heart: regular rate and rhythm, S1, S2 normal, no murmur, click, rub or gallop Skin: Skin color, texture, turgor normal. No rashes or lesions    Recent Results (from the past 14 hours)  POCT CoVid-19 nucleic acid   Collection Time: 01/23/24 10:21 AM  Result Value Ref Range   SARS-COV-2, NAA Negative Negative  POCT Flu Nucleic Acid   Collection Time: 01/23/24 10:21 AM  Result Value Ref Range   Influenza A/B Negative Negative    No results found.       [1] Outpatient Encounter Medications as of 01/23/2024  Medication Sig Dispense Refill  . aspirin  81 mg chewable tablet Chew one tablet (81 mg dose) by mouth daily.    SABRA losartan potassium (COZAAR)  50 mg tablet Take one tablet (50 mg dose) by mouth daily. 90 tablet 3  . ondansetron  (ZOFRAN -ODT) 4 mg disintegrating tablet Take one tablet (4 mg dose) by mouth every 8 (eight) hours as needed for Nausea. 10 tablet 0  . oseltamivir phosphate (TAMIFLU) 75 mg capsule Take one capsule (75 mg dose) by mouth 2 (two) times daily for 5 days. 10 capsule 0  . promethazine-dextromethorphan (PROMETHAZINE-DM) 6.25-15 MG/5ML syrup Take 5 mLs by mouth 3 (three) times a day as needed for Cough. 120  mL 0  . tadalafil (CIALIS) 5 MG tablet Take one tablet (5 mg dose) by mouth daily as needed. (Patient not taking: Reported on 01/23/2024) 90 tablet 3   No facility-administered encounter medications on file as of 01/23/2024.  [2] Past Medical History: Diagnosis Date  . Arthritis    normal for age  . Colon polyp    one removed at colonoscopy  . ED (erectile dysfunction)   . Hyperlipidemia   . Hypertension last visit prescribed medication  . Undescended testicle   [3] No Known Allergies [4] Social History Socioeconomic History  . Marital status: Married  Occupational History  . Occupation: pastor  Tobacco Use  . Smoking status: Never  . Smokeless tobacco: Never  Vaping Use  . Vaping status: Never Used  Substance and Sexual Activity  . Alcohol use: No  . Drug use: No  . Sexual activity: Yes    Partners: Female

## 2024-01-23 NOTE — Assessment & Plan Note (Addendum)
 Continue with azithromycin  500 mg IV daily, ceftriaxone  2 g IV daily to complete a 5-day course Incentive spirometry, flutter valve q2h while awake Maintain SpO2 > 92 percent Maintain MAP g> 65 Check MRSA PCR Aspiration precautions

## 2024-01-24 DIAGNOSIS — A419 Sepsis, unspecified organism: Secondary | ICD-10-CM

## 2024-01-24 DIAGNOSIS — R6520 Severe sepsis without septic shock: Secondary | ICD-10-CM

## 2024-01-24 DIAGNOSIS — E663 Overweight: Secondary | ICD-10-CM

## 2024-01-24 NOTE — Plan of Care (Signed)

## 2024-01-24 NOTE — Progress Notes (Signed)
  Progress Note   Patient: Wesley Colon FMW:[id number] DOB: 04-12-1950 DOA: 01/23/2024     1 DOS: the patient was seen and examined on 01/24/2024   Brief hospital course: 73 year old man no significant PMH presented with low blood pressure and syncope, coughing.  Admitted for severe sepsis secondary to right lower lobe pneumonia.  Consultants None   Procedures/Events 12/6 admitted for sepsis, pneumonia  Assessment and Plan: Severe sepsis present on admission secondary to Community acquired pneumonia with associated Syncope  CT chest no PE.  Dense right lower lobe airspace disease.  Patient denies any swallowing difficulties. Sepsis clinically resolved.  Continue empiric antibiotics.   No hypoxia noted.   Continue with azithromycin  500 mg IV daily, ceftriaxone  2 g IV daily to complete a 5-day course  Elevated troponin Trivial elevation in the setting of sepsis.  No further evaluation suggested.  Overweight: BMI 25.0-29.9 kg/m  Body mass index is 27.48 kg/m.   Overall improving, if continues to prove, anticipate discharge 12/8.    Subjective:  Feels much better today  Physical Exam: Vitals:   01/23/24 2300 01/23/24 2357 01/24/24 0446 01/24/24 1017  BP:  97/72 112/72 98/67  Pulse:  (!) 101 (!) 102 99  Resp: (!) 24 17 20    Temp:  98.5 F (36.9 C) (!) 100.4 F (38 C) 98 F (36.7 C)  TempSrc:  Oral Oral Oral  SpO2:  96% 98% 97%  Weight:      Height:       Physical Exam Vitals reviewed.  Constitutional:      General: He is not in acute distress.    Appearance: He is not ill-appearing or toxic-appearing.  Cardiovascular:     Rate and Rhythm: Normal rate and regular rhythm.     Heart sounds: No murmur heard. Pulmonary:     Effort: Pulmonary effort is normal. No respiratory distress.     Breath sounds: Rales (low right posterior) present. No wheezing.  Musculoskeletal:     Right lower leg: No edema.     Left lower leg: No edema.  Neurological:     Mental  Status: He is alert.  Psychiatric:        Mood and Affect: Mood normal.        Behavior: Behavior normal.     Data Reviewed: Complete metabolic panel noted Troponins mildly elevated 22, 26 CRP elevated 12.7 Lactic acid 3.6, 4, 2.6 WBC 13.1 ESR 5 CT chest noted   Family Communication: wife at bedside  Disposition: Status is: Inpatient Remains inpatient appropriate because: sepsis     Time spent: 35 minutes  Author: Toribio Door, MD 01/24/2024 1:44 PM  For on call review [email or web address].

## 2024-01-25 ENCOUNTER — Other Ambulatory Visit (HOSPITAL_COMMUNITY): Payer: Self-pay

## 2024-01-25 DIAGNOSIS — R7989 Other specified abnormal findings of blood chemistry: Secondary | ICD-10-CM

## 2024-01-25 MED ORDER — AZITHROMYCIN 250 MG PO TABS
500.0000 mg | ORAL_TABLET | Freq: Once | ORAL | Status: AC
  Administered 2024-01-25: 500 mg via ORAL
  Filled 2024-01-25: qty 2

## 2024-01-25 MED ORDER — AMOXICILLIN-POT CLAVULANATE 875-125 MG PO TABS
1.0000 | ORAL_TABLET | Freq: Two times a day (BID) | ORAL | Status: DC
  Administered 2024-01-25: 1 via ORAL
  Filled 2024-01-25: qty 1

## 2024-01-25 MED ORDER — AZITHROMYCIN 500 MG PO TABS
500.0000 mg | ORAL_TABLET | Freq: Every day | ORAL | 0 refills | Status: AC
  Filled 2024-01-25: qty 2, 2d supply, fill #0

## 2024-01-25 MED ORDER — AMOXICILLIN-POT CLAVULANATE 875-125 MG PO TABS
1.0000 | ORAL_TABLET | Freq: Two times a day (BID) | ORAL | 0 refills | Status: AC
  Filled 2024-01-25: qty 10, 5d supply, fill #0

## 2024-01-25 NOTE — Discharge Summary (Signed)
 Physician Discharge Summary   Patient: Wesley Colon MRN: [id number] DOB: 1951-01-01  Admit date:     01/23/2024  Discharge date: 01/25/24  Discharge Physician: Toribio Door   PCP: Wells Ditch, MD (Inactive)   Recommendations at discharge:   Resolution of pneumonia  Discharge Diagnoses: Principal Problem:   Severe sepsis [facility]) Active Problems:   Right lower lobe pneumonia   Leukocytosis   Elevated lactic acid level   Overweight (BMI 25.0-29.9)   Sinus tachycardia  Resolved Problems:   * No resolved hospital problems. *  Hospital Course: 73 year old man no significant PMH presented with low blood pressure and syncope, coughing.  Admitted for severe sepsis secondary to right lower lobe pneumonia.  Condition rapidly improved and he was discharged home in good condition.  Consultants None   Procedures/Events 12/6 admitted for sepsis, pneumonia  Severe sepsis present on admission secondary to Community acquired pneumonia with associated Syncope  CT chest no PE.  Dense right lower lobe airspace disease.  Patient denies any swallowing difficulties. Sepsis clinically resolved.  Continue empiric antibiotics.   No hypoxia noted.   Complete 5-day course of antibiotics.   Elevated troponin Trivial elevation in the setting of sepsis.  No further evaluation suggested.   Overweight: BMI 25.0-29.9 kg/m  Body mass index is 27.48 kg/m.  Disposition: Home Diet recommendation:  Regular diet DISCHARGE MEDICATION: Allergies as of 01/25/2024   No Known Allergies      Medication List     STOP taking these medications    TURMERIC PO       TAKE these medications    amoxicillin -clavulanate 875-125 MG tablet Commonly known as: AUGMENTIN  Take 1 tablet by mouth 2 (two) times daily for 5 days. Start taking on: January 26, 2024   aspirin  81 MG chewable tablet Chew 81 mg by mouth daily.   azithromycin  500 MG tablet Commonly known as: Zithromax  Take 1 tablet  (500 mg total) by mouth daily for 2 days. Start 12/9 in AM Start taking on: January 27, 2024   FISH OIL PO Take 950 mg by mouth daily.   losartan 50 MG tablet Commonly known as: COZAAR Take 50 mg by mouth daily.   PROSTATE SUPPORT PO Take 1 capsule by mouth daily.   Vitamin B-12 5000 MCG Tbdp Take 5,000 mcg by mouth daily.   vitamin C 1000 MG tablet Take 1,000 mg by mouth daily.   Vitamin D3 250 MCG (10000 UT) Tabs Take 10,000 Units by mouth daily.        Follow-up Information     Wells Ditch, MD. Schedule an appointment as soon as possible for a visit.   Specialty: Family Medicine Why: As needed Contact information: [address] [place] [address] [phone]                Feels good, ready to go home  Discharge Exam: Filed Weights   01/23/24 1126  Weight: 89.4 kg   Physical Exam Vitals reviewed.  Constitutional:      General: He is not in acute distress.    Appearance: He is not ill-appearing or toxic-appearing.  Cardiovascular:     Rate and Rhythm: Normal rate and regular rhythm.     Heart sounds: No murmur heard. Pulmonary:     Effort: Pulmonary effort is normal. No respiratory distress.     Breath sounds: No wheezing, rhonchi or rales.  Neurological:     Mental Status: He is alert.  Psychiatric:  Mood and Affect: Mood normal.        Behavior: Behavior normal.      Condition at discharge: good  The results of significant diagnostics from this hospitalization (including imaging, microbiology, ancillary and laboratory) are listed below for reference.   Imaging Studies: CT Angio Chest PE W and/or Wo Contrast Result Date: 01/23/2024 CLINICAL DATA:  Right-sided chest pain and cough EXAM: CT ANGIOGRAPHY CHEST WITH CONTRAST TECHNIQUE: Multidetector CT imaging of the chest was performed using the standard protocol during bolus administration of intravenous contrast. Multiplanar CT image reconstructions and MIPs were  obtained to evaluate the vascular anatomy. RADIATION DOSE REDUCTION: This exam was performed according to the departmental dose-optimization program which includes automated exposure control, adjustment of the mA and/or kV according to patient size and/or use of iterative reconstruction technique. CONTRAST:  75mL OMNIPAQUE  IOHEXOL  350 MG/ML SOLN COMPARISON:  01/23/2024 FINDINGS: Cardiovascular: This is a technically adequate evaluation of the pulmonary vasculature. No filling defects or pulmonary emboli. The heart is unremarkable without pericardial effusion. No evidence of thoracic aortic aneurysm or dissection. Mediastinum/Nodes: No enlarged mediastinal, hilar, or axillary lymph nodes. Thyroid  gland, trachea, and esophagus demonstrate no significant findings. Lungs/Pleura: Since the previous chest x-ray, dense right lower lobe airspace disease has developed, consistent with aspiration, infection, or hemorrhage. No effusion or pneumothorax. The central airways are patent. Upper Abdomen: No acute abnormality. Musculoskeletal: No acute or destructive bony abnormalities. Reconstructed images demonstrate no additional findings. Review of the MIP images confirms the above findings. IMPRESSION: 1. No evidence of pulmonary embolus. 2. Interval development of dense right lower lobe airspace disease since the preceding chest x-ray, consistent with aspiration, infection, or hemorrhage. Electronically Signed   By: Ozell Daring M.D.   On: 01/23/2024 13:56   DG Chest Portable 1 View Result Date: 01/23/2024 CLINICAL DATA:  Right-sided chest pain, cough EXAM: PORTABLE CHEST 1 VIEW COMPARISON:  None Available. FINDINGS: The heart size and mediastinal contours are within normal limits. Both lungs are clear. The visualized skeletal structures are unremarkable. IMPRESSION: No active disease. Electronically Signed   By: Ozell Daring M.D.   On: 01/23/2024 13:15    Microbiology: Results for orders placed or performed during  the hospital encounter of 01/23/24  Blood Culture (routine x 2)     Status: None (Preliminary result)   Collection Time: 01/23/24 12:20 PM   Specimen: BLOOD  Result Value Ref Range Status   Specimen Description   Final    BLOOD BLOOD RIGHT FOREARM Performed at [facility], [address] [place], [place] [address]    Special Requests   Final    Blood Culture adequate volume Performed at [facility], [address] [place], [place] [address]    Culture   Final    NO GROWTH 2 DAYS Performed at [facility], [address] [place], [place] [address]    Report Status PENDING  Incomplete  Blood Culture (routine x 2)     Status: None (Preliminary result)   Collection Time: 01/23/24  1:01 PM   Specimen: BLOOD LEFT FOREARM  Result Value Ref Range Status   Specimen Description   Final    BLOOD LEFT FOREARM Performed at [facility], [address], [place], [place] [address]    Special Requests   Final    BOTTLES DRAWN AEROBIC AND ANAEROBIC Blood Culture results may not be optimal due to an inadequate volume of blood received in culture bottles Performed at [facility],  [address], [place], [place] [address]    Culture   Final    NO GROWTH 2 DAYS Performed at [facility], [address], [place], [place] [address]    Report Status PENDING  Incomplete  Resp Panel by RT-PCR (Flu A&B, Covid) Anterior Nasal Swab     Status: None   Collection Time: 01/23/24  1:29 PM   Specimen: Anterior Nasal Swab  Result Value Ref Range Status   SARS Coronavirus 2 by RT PCR NEGATIVE NEGATIVE Final    Comment: (NOTE) SARS-CoV-2 target nucleic acids are NOT DETECTED.  The SARS-CoV-2 RNA is generally detectable in upper respiratory specimens during the acute phase of infection. The lowest concentration of SARS-CoV-2 viral copies this assay can detect is 138 copies/mL. A negative result does not  preclude SARS-Cov-2 infection and should not be used as the sole basis for treatment or other patient management decisions. A negative result may occur with  improper specimen collection/handling, submission of specimen other than nasopharyngeal swab, presence of viral mutation(s) within the areas targeted by this assay, and inadequate number of viral copies(<138 copies/mL). A negative result must be combined with clinical observations, patient history, and epidemiological information. The expected result is Negative.  Fact Sheet for Patients:  bloggercourse.com  Fact Sheet for Healthcare Providers:  seriousbroker.it  This test is no t yet approved or cleared by the [place]  FDA and  has been authorized for detection and/or diagnosis of SARS-CoV-2 by FDA under an Emergency Use Authorization (EUA). This EUA will remain  in effect (meaning this test can be used) for the duration of the COVID-19 declaration under Section 564(b)(1) of the Act, 21 U.S.C.section 360bbb-3(b)(1), unless the authorization is terminated  or revoked sooner.       Influenza A by PCR NEGATIVE NEGATIVE Final   Influenza B by PCR NEGATIVE NEGATIVE Final    Comment: (NOTE) The Xpert Xpress SARS-CoV-2/FLU/RSV plus assay is intended as an aid in the diagnosis of influenza from Nasopharyngeal swab specimens and should not be used as a sole basis for treatment. Nasal washings and aspirates are unacceptable for Xpert Xpress SARS-CoV-2/FLU/RSV testing.  Fact Sheet for Patients: bloggercourse.com  Fact Sheet for Healthcare Providers: seriousbroker.it  This test is not yet approved or cleared by the [place]  FDA and has been authorized for detection and/or diagnosis of SARS-CoV-2 by FDA under an Emergency Use Authorization (EUA). This EUA will remain in effect (meaning this test can be used) for the  duration of the COVID-19 declaration under Section 564(b)(1) of the Act, 21 U.S.C. section 360bbb-3(b)(1), unless the authorization is terminated or revoked.  Performed at [facility], [address], [place], [place] [address]     Labs: CBC: Recent Labs  Lab 01/23/24 1220  WBC 13.1*  NEUTROABS 11.9*  HGB 13.8  HCT 40.4  MCV 94.4  PLT 235   Basic Metabolic Panel: Recent Labs  Lab 01/23/24 1220  NA 139  K 3.9  CL 102  CO2 21*  GLUCOSE 146*  BUN 17  CREATININE 1.11  CALCIUM 9.2   Liver Function Tests: Recent Labs  Lab 01/23/24 1220  AST 21  ALT 16  ALKPHOS 73  BILITOT 0.9  PROT 6.9  ALBUMIN 4.2   CBG: No results for input(s): GLUCAP in the last 168 hours.  Discharge time spent: less than 30 minutes.  Signed: Toribio Door, MD [facility] 01/25/2024

## 2024-01-25 NOTE — Progress Notes (Signed)
 AVS given to patient and explained at the bedside. Medications and follow up appointments have been explained with pt verbalizing understanding.

## 2024-01-25 NOTE — Plan of Care (Signed)
   Problem: Coping: Goal: Level of anxiety will decrease Outcome: Progressing   Problem: Elimination: Goal: Will not experience complications related to bowel motility Outcome: Progressing   Problem: Safety: Goal: Ability to remain free from injury will improve Outcome: Progressing

## 2024-01-28 LAB — CULTURE, BLOOD (ROUTINE X 2)
Culture: NO GROWTH
Culture: NO GROWTH
Special Requests: ADEQUATE
